# Patient Record
Sex: Female | Born: 1985 | Race: White | Hispanic: No | Marital: Married | Smoking: Never smoker
Health system: Southern US, Community
[De-identification: ages and names within clinical notes are randomized; demographics above are authoritative.]

## PROBLEM LIST (undated history)

## (undated) DIAGNOSIS — E538 Deficiency of other specified B group vitamins: Secondary | ICD-10-CM

## (undated) DIAGNOSIS — E559 Vitamin D deficiency, unspecified: Secondary | ICD-10-CM

## (undated) DIAGNOSIS — I493 Ventricular premature depolarization: Secondary | ICD-10-CM

## (undated) DIAGNOSIS — O139 Gestational [pregnancy-induced] hypertension without significant proteinuria, unspecified trimester: Secondary | ICD-10-CM

## (undated) DIAGNOSIS — I10 Essential (primary) hypertension: Secondary | ICD-10-CM

## (undated) DIAGNOSIS — F32A Depression, unspecified: Secondary | ICD-10-CM

## (undated) DIAGNOSIS — R7303 Prediabetes: Secondary | ICD-10-CM

## (undated) DIAGNOSIS — K219 Gastro-esophageal reflux disease without esophagitis: Secondary | ICD-10-CM

## (undated) DIAGNOSIS — F419 Anxiety disorder, unspecified: Secondary | ICD-10-CM

## (undated) HISTORY — PX: NO PAST SURGERIES: SHX2092

---

## 2013-03-03 DIAGNOSIS — G43009 Migraine without aura, not intractable, without status migrainosus: Secondary | ICD-10-CM | POA: Insufficient documentation

## 2013-10-27 DIAGNOSIS — E669 Obesity, unspecified: Secondary | ICD-10-CM | POA: Insufficient documentation

## 2013-10-27 DIAGNOSIS — E785 Hyperlipidemia, unspecified: Secondary | ICD-10-CM | POA: Insufficient documentation

## 2014-06-19 HISTORY — PX: ESOPHAGOGASTRODUODENOSCOPY: SHX1529

## 2018-03-12 ENCOUNTER — Emergency Department: Payer: BLUE CROSS/BLUE SHIELD

## 2018-03-12 ENCOUNTER — Other Ambulatory Visit: Payer: Self-pay

## 2018-03-12 ENCOUNTER — Encounter: Payer: Self-pay | Admitting: Emergency Medicine

## 2018-03-12 ENCOUNTER — Emergency Department
Admission: EM | Admit: 2018-03-12 | Discharge: 2018-03-12 | Disposition: A | Discharge status: Home / Self Care | Payer: BLUE CROSS/BLUE SHIELD | Attending: Emergency Medicine | Admitting: Emergency Medicine

## 2018-03-12 DIAGNOSIS — Z3202 Encounter for pregnancy test, result negative: Secondary | ICD-10-CM | POA: Insufficient documentation

## 2018-03-12 DIAGNOSIS — R1012 Left upper quadrant pain: Secondary | ICD-10-CM | POA: Insufficient documentation

## 2018-03-12 HISTORY — DX: Gastro-esophageal reflux disease without esophagitis: K21.9

## 2018-03-12 LAB — COMPREHENSIVE METABOLIC PANEL
ALBUMIN: 4 g/dL (ref 3.5–5.0)
ALT: 28 U/L (ref 0–44)
AST: 18 U/L (ref 15–41)
Alkaline Phosphatase: 88 U/L (ref 38–126)
Anion gap: 6 (ref 5–15)
BILIRUBIN TOTAL: 0.6 mg/dL (ref 0.3–1.2)
BUN: 11 mg/dL (ref 6–20)
CALCIUM: 9.3 mg/dL (ref 8.9–10.3)
CO2: 27 mmol/L (ref 22–32)
CREATININE: 0.83 mg/dL (ref 0.44–1.00)
Chloride: 103 mmol/L (ref 98–111)
GFR calc Af Amer: >60 mL/min (ref >60)
GFR calc non Af Amer: >60 mL/min (ref >60)
GLUCOSE: 87 mg/dL (ref 70–99)
Potassium: 3.6 mmol/L (ref 3.5–5.1)
Sodium: 136 mmol/L (ref 135–145)
TOTAL PROTEIN: 7.2 g/dL (ref 6.5–8.1)

## 2018-03-12 LAB — LIPASE, BLOOD: Lipase: 26 U/L (ref 11–51)

## 2018-03-12 LAB — CBC
HEMATOCRIT: 43.7 % (ref 35.0–47.0)
Hemoglobin: 14.5 g/dL (ref 12.0–16.0)
MCH: 25.5 pg — ABNORMAL LOW (ref 26.0–34.0)
MCHC: 33.2 g/dL (ref 32.0–36.0)
MCV: 76.8 fL — ABNORMAL LOW (ref 80.0–100.0)
Platelets: 347 10*3/uL (ref 150–440)
RBC: 5.68 MIL/uL — ABNORMAL HIGH (ref 3.80–5.20)
RDW: 15 % — AB (ref 11.5–14.5)
WBC: 18.7 10*3/uL — ABNORMAL HIGH (ref 3.6–11.0)

## 2018-03-12 LAB — URINALYSIS, COMPLETE (UACMP) WITH MICROSCOPIC
BACTERIA UA: NONE SEEN
Bilirubin Urine: NEGATIVE
Glucose, UA: NEGATIVE mg/dL
HGB URINE DIPSTICK: NEGATIVE
Ketones, ur: NEGATIVE mg/dL
Leukocytes, UA: NEGATIVE
NITRITE: NEGATIVE
Protein, ur: NEGATIVE mg/dL
SPECIFIC GRAVITY, URINE: 1.009 (ref 1.005–1.030)
pH: 6 (ref 5.0–8.0)

## 2018-03-12 LAB — POCT PREGNANCY, URINE: Preg Test, Ur: NEGATIVE

## 2018-03-12 LAB — TROPONIN I

## 2018-03-12 MED ORDER — GI COCKTAIL ~~LOC~~
30.0000 mL | Freq: Once | ORAL | Status: AC
  Administered 2018-03-12: 30 mL via ORAL
  Filled 2018-03-12: qty 30

## 2018-03-12 MED ORDER — SUCRALFATE 1 G PO TABS: 1.0000 g | ORAL_TABLET | Freq: Four times a day (QID) | ORAL | 0 refills | Status: DC

## 2018-03-12 MED ORDER — IOPAMIDOL (ISOVUE-300) INJECTION 61%
125.0000 mL | Freq: Once | INTRAVENOUS | Status: AC | PRN
  Administered 2018-03-12: 125 mL via INTRAVENOUS
  Filled 2018-03-12: qty 150

## 2018-03-12 NOTE — Discharge Instructions (Signed)
Please seek medical attention for any high fevers, chest pain, shortness of breath, change in behavior, persistent vomiting, bloody stool or any other new or concerning symptoms.  

## 2018-03-12 NOTE — ED Triage Notes (Signed)
Patient ambulatory to triage with steady gait, without difficulty or distress noted ; pt reports since yesterday having left upper abd pain radiating into left shoulder accomp by N/V

## 2018-03-12 NOTE — ED Provider Notes (Addendum)
[facility] Emergency Department Provider Note   ____________________________________________   I have reviewed the triage vital signs and the nursing notes.   HISTORY  Chief Complaint Abdominal Pain   History limited by: Not Limited   HPI Rachel Shaw is a 32 y.o. female who presents to the emergency department today because of concerns for abdominal pain.  Is located left upper quadrant.  Patient states she has had problems with pain in this area for over a year.  She states she has been told it was acid reflux in the past.  She also has undergone an EGD.  She states for the past 2 days however the pain is been more intense.  She does take her antiacid and states that she takes it regularly.  She denies any unusual ingestions.  She denies any shortness of breath with this.  She has had a cough for about a week.  Denies any fevers.  Denies any change in stooling.   Per medical record review patient has a history of GERD  Past Medical History:  Diagnosis Date  . GERD (gastroesophageal reflux disease)     There are no active problems to display for this patient.   History reviewed. No pertinent surgical history.  Prior to Admission medications   Not on File    Allergies Azo [phenazopyridine]  No family history on file.  Social History Social History   Tobacco Use  . Smoking status: Never Smoker  . Smokeless tobacco: Never Used  Substance Use Topics  . Alcohol use: Not on file  . Drug use: Not on file    Review of Systems Constitutional: No fever/chills Eyes: No visual changes. ENT: No sore throat. Cardiovascular: Denies chest pain. Respiratory: Denies shortness of breath. Gastrointestinal: Positive for abdominal pain Genitourinary: Negative for dysuria. Musculoskeletal: Negative for back pain. Skin: Negative for rash. Neurological: Negative for headaches, focal weakness or  numbness.  ____________________________________________   PHYSICAL EXAM:  VITAL SIGNS: ED Triage Vitals  Enc Vitals Group     BP 03/12/18 1956 (!) 152/84     Pulse Rate 03/12/18 1956 92     Resp 03/12/18 1956 18     Temp 03/12/18 1955 (P) 98.5 F (36.9 C)     Temp Source 03/12/18 1955 (P) Oral     SpO2 03/12/18 1956 99 %     Weight 03/12/18 1957 (!) 344 lb (156 kg)     Height 03/12/18 1957 5\' 10"  (1.778 m)     Head Circumference --      Peak Flow --      Pain Score 03/12/18 2000 5   Constitutional: Alert and oriented.  Eyes: Conjunctivae are normal.  ENT      Head: Normocephalic and atraumatic.      Nose: No congestion/rhinnorhea.      Mouth/Throat: Mucous membranes are moist.      Neck: No stridor. Hematological/Lymphatic/Immunilogical: No cervical lymphadenopathy. Cardiovascular: Normal rate, regular rhythm.  No murmurs, rubs, or gallops.  Respiratory: Normal respiratory effort without tachypnea nor retractions. Breath sounds are clear and equal bilaterally. No wheezes/rales/rhonchi. Gastrointestinal: Soft and non tender. No rebound. No guarding.  Genitourinary: Deferred Musculoskeletal: Normal range of motion in all extremities. No lower extremity edema. Neurologic:  Normal speech and language. No gross focal neurologic deficits are appreciated.  Skin:  Skin is warm, dry and intact. No rash noted. Psychiatric: Mood and affect are normal. Speech and behavior are normal. Patient exhibits appropriate insight and judgment.  ____________________________________________  LABS (pertinent positives/negatives)  Upreg negative CBC wbc 18.7, hgb 14.5, plt 347 UA unremarkable  ____________________________________________   EKG  I, Phineas Semen, attending physician, personally viewed and interpreted this EKG  EKG Time: 2002 Rate: 74 Rhythm: normal sinus rhythm Axis: normal Intervals: qtc 430 QRS: narrow ST changes: no st elevation Impression: left atrial  enlargement, abnormal ekg   ____________________________________________    RADIOLOGY  CT abd pel No acute findings  ____________________________________________   PROCEDURES  Procedures  ____________________________________________   INITIAL IMPRESSION / ASSESSMENT AND PLAN / ED COURSE  Pertinent labs & imaging results that were available during my care of the patient were reviewed by me and considered in my medical decision making (see chart for details).   Presented to the emergency department today because of concerns for left upper quadrant abdominal pain.  She does have a history of some chronic pain there but states is been worse recently.  Patient's blood work does show significant leukocytosis.  Discussed with the patient she states she is on steroids so it is somewhat hard to interpret.  Given that the pain was different in nature CT scan was obtained.  This did not show any concerning findings.  Discussed this with the patient.  At this point will plan on discharging to follow-up with primary care.  ____________________________________________   FINAL CLINICAL IMPRESSION(S) / ED DIAGNOSES  Final diagnoses:  Left upper quadrant pain     Note: This dictation was prepared with Dragon dictation. Any transcriptional errors that result from this process are unintentional     Phineas Semen, MD 03/12/18 2542    Phineas Semen, MD 03/27/18 [phone]

## 2018-03-13 ENCOUNTER — Emergency Department
Admission: EM | Admit: 2018-03-13 | Discharge: 2018-03-13 | Disposition: A | Discharge status: Home / Self Care | Payer: BLUE CROSS/BLUE SHIELD | Attending: Emergency Medicine | Admitting: Emergency Medicine

## 2018-03-13 ENCOUNTER — Encounter: Payer: Self-pay | Admitting: *Deleted

## 2018-03-13 ENCOUNTER — Emergency Department: Payer: BLUE CROSS/BLUE SHIELD

## 2018-03-13 ENCOUNTER — Other Ambulatory Visit: Payer: Self-pay

## 2018-03-13 DIAGNOSIS — K2900 Acute gastritis without bleeding: Secondary | ICD-10-CM | POA: Diagnosis not present

## 2018-03-13 DIAGNOSIS — R1013 Epigastric pain: Secondary | ICD-10-CM

## 2018-03-13 DIAGNOSIS — R1012 Left upper quadrant pain: Secondary | ICD-10-CM | POA: Diagnosis present

## 2018-03-13 LAB — COMPREHENSIVE METABOLIC PANEL
ALK PHOS: 89 U/L (ref 38–126)
ALT: 26 U/L (ref 0–44)
ANION GAP: 9 (ref 5–15)
AST: 15 U/L (ref 15–41)
Albumin: 4 g/dL (ref 3.5–5.0)
BILIRUBIN TOTAL: 0.6 mg/dL (ref 0.3–1.2)
BUN: 11 mg/dL (ref 6–20)
CALCIUM: 8.8 mg/dL — AB (ref 8.9–10.3)
CO2: 26 mmol/L (ref 22–32)
CREATININE: 0.79 mg/dL (ref 0.44–1.00)
Chloride: 104 mmol/L (ref 98–111)
Glucose, Bld: 99 mg/dL (ref 70–99)
Potassium: 4 mmol/L (ref 3.5–5.1)
Sodium: 139 mmol/L (ref 135–145)
TOTAL PROTEIN: 7.2 g/dL (ref 6.5–8.1)

## 2018-03-13 LAB — CBC
HCT: 42.7 % (ref 35.0–47.0)
Hemoglobin: 14.2 g/dL (ref 12.0–16.0)
MCH: 25.4 pg — AB (ref 26.0–34.0)
MCHC: 33.2 g/dL (ref 32.0–36.0)
MCV: 76.3 fL — AB (ref 80.0–100.0)
PLATELETS: 310 10*3/uL (ref 150–440)
RBC: 5.6 MIL/uL — AB (ref 3.80–5.20)
RDW: 15 % — ABNORMAL HIGH (ref 11.5–14.5)
WBC: 15.1 10*3/uL — ABNORMAL HIGH (ref 3.6–11.0)

## 2018-03-13 MED ORDER — ONDANSETRON HCL 4 MG/2ML IJ SOLN
4.0000 mg | Freq: Once | INTRAMUSCULAR | Status: AC
  Administered 2018-03-13: 4 mg via INTRAVENOUS
  Filled 2018-03-13: qty 2

## 2018-03-13 MED ORDER — FAMOTIDINE IN NACL 20-0.9 MG/50ML-% IV SOLN
20.0000 mg | Freq: Once | INTRAVENOUS | Status: AC
  Administered 2018-03-13: 20 mg via INTRAVENOUS
  Filled 2018-03-13: qty 50

## 2018-03-13 MED ORDER — ACETAMINOPHEN 500 MG PO TABS
1000.0000 mg | ORAL_TABLET | Freq: Once | ORAL | Status: AC
  Administered 2018-03-13: 1000 mg via ORAL
  Filled 2018-03-13: qty 2

## 2018-03-13 MED ORDER — OMEPRAZOLE 40 MG PO CPDR: 40.0000 mg | DELAYED_RELEASE_CAPSULE | Freq: Every day | ORAL | 0 refills | Status: DC

## 2018-03-13 MED ORDER — GI COCKTAIL ~~LOC~~: 30.0000 mL | Freq: Once | ORAL | Status: DC

## 2018-03-13 NOTE — ED Triage Notes (Signed)
Pt ambulatory to triage. Pt has left upper quad abd pain.  Pt was seen yesterday with similar sx and had normal ct scan per pt.  No n/v/d  Pt states pain is worse.  Pt alert

## 2018-03-13 NOTE — ED Provider Notes (Signed)
[facility] Emergency Department Provider Note  ____________________________________________  Time seen: Approximately 8:07 PM  I have reviewed the triage vital signs and the nursing notes.   HISTORY  Chief Complaint Abdominal Pain   HPI Rachel Shaw is a 32 y.o. female with a history of GERD who presents for evaluation of left upper quadrant abdominal pain. Patient reports 2 days of squeezing intermittent pain located in the left lower quadrant that is nonradiating and associated with nausea. The pain was worse today after she had [employer]. She was seen here yesterday with labs show leukocytosis of unknown etiology and a negative CT scan. Her pain was better when she went home. Today she had no pain until she ate [employer] and the pain returned. Currently the pain is 5 out of 10. No vomiting, no fever or chills, no chest pain, no shortness of breath, no pleuritic pain, no dysuria or hematuria. No prior history of peptic ulcer disease, no heavy alcohol use, no NSAIDs use. Patient denies any personal or family history of blood clots, recent travel or immobilization, leg pain or swelling, hemoptysis, or exogenous hormones.patient has been having normal bowel movements.patient reports the pain started 3 days after being put on prednisone for a viral URI.  Past Medical History:  Diagnosis Date  . GERD (gastroesophageal reflux disease)      Prior to Admission medications   Medication Sig Start Date End Date Taking? Authorizing Provider  omeprazole (PRILOSEC) 40 MG capsule Take 1 capsule (40 mg total) by mouth daily. 03/13/18 04/12/18  Nita Sickle, MD  sucralfate (CARAFATE) 1 g tablet Take 1 tablet (1 g total) by mouth 4 (four) times daily. 03/12/18   Phineas Semen, MD    Allergies Azo [phenazopyridine]  No family history on file.  Social History Social History   Tobacco Use  . Smoking status: Never Smoker  . Smokeless tobacco: Never Used    Substance Use Topics  . Alcohol use: Not Currently  . Drug use: Not Currently    Review of Systems  Constitutional: Negative for fever. Eyes: Negative for visual changes. ENT: Negative for sore throat. Neck: No neck pain  Cardiovascular: Negative for chest pain. Respiratory: Negative for shortness of breath. Gastrointestinal: + LUQ abdominal pain and nausea. No vomiting or diarrhea. Genitourinary: Negative for dysuria. Musculoskeletal: Negative for back pain. Skin: Negative for rash. Neurological: Negative for headaches, weakness or numbness. Psych: No SI or HI  ____________________________________________   PHYSICAL EXAM:  VITAL SIGNS: ED Triage Vitals [03/13/18 1722]  Enc Vitals Group     BP 121/82     Pulse Rate 97     Resp 20     Temp 98.8 F (37.1 C)     Temp Source Oral     SpO2      Weight      Height      Head Circumference      Peak Flow      Pain Score 7     Pain Loc      Pain Edu?      Excl. in GC?     Constitutional: Alert and oriented. Well appearing and in no apparent distress. HEENT:      Head: Normocephalic and atraumatic.         Eyes: Conjunctivae are normal. Sclera is non-icteric.       Mouth/Throat: Mucous membranes are moist.       Neck: Supple with no signs of meningismus. Cardiovascular: Regular rate and rhythm. No  murmurs, gallops, or rubs. 2+ symmetrical distal pulses are present in all extremities. No JVD. Respiratory: Normal respiratory effort. Lungs are clear to auscultation bilaterally. No wheezes, crackles, or rhonchi.  Gastrointestinal: Obese, tender to palpation over the epigastric and LUQ, and non distended with positive bowel sounds. No rebound or guarding. Genitourinary: No CVA tenderness. Musculoskeletal: Nontender with normal range of motion in all extremities. No edema, cyanosis, or erythema of extremities. Neurologic: Normal speech and language. Face is symmetric. Moving all extremities. No gross focal neurologic  deficits are appreciated. Skin: Skin is warm, dry and intact. No rash noted. Psychiatric: Mood and affect are normal. Speech and behavior are normal.  ____________________________________________   LABS (all labs ordered are listed, but only abnormal results are displayed)  Labs Reviewed  COMPREHENSIVE METABOLIC PANEL - Abnormal; Notable for the following components:      Result Value   Calcium 8.8 (*)    All other components within normal limits  CBC - Abnormal; Notable for the following components:   WBC 15.1 (*)    RBC 5.60 (*)    MCV 76.3 (*)    MCH 25.4 (*)    RDW 15.0 (*)    All other components within normal limits   ____________________________________________  EKG  ED ECG REPORT I, Nita Sickle, the attending physician, personally viewed and interpreted this ECG.  Normal sinus rhythm, rate of 74, normal intervals, normal axis, no ST elevations or depressions, T-wave inversions in anterior leads. unchanged from prior.  ____________________________________________  RADIOLOGY  I have personally reviewed the images performed during this visit and I agree with the Radiologist's read.   Interpretation by Radiologist:  Ct Abdomen Pelvis W Contrast  Result Date: 03/12/2018 CLINICAL DATA:  Left upper quadrant abdominal pain EXAM: CT ABDOMEN AND PELVIS WITH CONTRAST TECHNIQUE: Multidetector CT imaging of the abdomen and pelvis was performed using the standard protocol following bolus administration of intravenous contrast. CONTRAST:  ISOVUE-300 IOPAMIDOL (ISOVUE-300) INJECTION 61% COMPARISON:  None. FINDINGS: LOWER CHEST: There is no basilar pleural or apical pericardial effusion. HEPATOBILIARY: The hepatic contours and density are normal. There is no intra- or extrahepatic biliary dilatation. The gallbladder is normal. PANCREAS: The pancreatic parenchymal contours are normal and there is no ductal dilatation. There is no peripancreatic fluid collection. SPLEEN:  Normal. ADRENALS/URINARY TRACT: --Adrenal glands: Normal. --Right kidney/ureter: No hydronephrosis, nephroureterolithiasis, perinephric stranding or solid renal mass. --Left kidney/ureter: No hydronephrosis, nephroureterolithiasis, perinephric stranding or solid renal mass. --Urinary bladder: Normal for degree of distention STOMACH/BOWEL: --Stomach/Duodenum: There is no hiatal hernia or other gastric abnormality. The duodenal course and caliber are normal. --Small bowel: No dilatation or inflammation. --Colon: No focal abnormality. --Appendix: Normal. VASCULAR/LYMPHATIC: Normal course and caliber of the major abdominal vessels. No abdominal or pelvic lymphadenopathy. REPRODUCTIVE: Normal uterus and ovaries. MUSCULOSKELETAL. No bony spinal canal stenosis or focal osseous abnormality. OTHER: None. IMPRESSION: Normal CT of the abdomen and pelvis. Electronically Signed   By: Deatra Robinson M.D.   On: 03/12/2018 22:58   US Abdomen Limited Ruq  Result Date: 03/13/2018 CLINICAL DATA:  Epigastric pain for 3 days with vomiting EXAM: ULTRASOUND ABDOMEN LIMITED RIGHT UPPER QUADRANT COMPARISON:  03/12/2018 FINDINGS: Gallbladder: Nonmobile echogenic 6 mm focus along the gallbladder wall, suspect polyp. No definite gallstones. No Murphy's sign or pericholecystic fluid. Normal wall thickness measuring 2.5 mm. Common bile duct: Diameter: 2.6 mm Liver: Mild heterogeneous echogenicity, suspect degree of fatty infiltration with areas of fatty sparing. Portal vein is patent on color Doppler imaging with normal  direction of blood flow towards the liver. IMPRESSION: No acute finding by ultrasound. Suspect tiny gallbladder polyp Negative for gallstones or biliary dilatation Suspect geographic fatty infiltration with areas of fatty sparing Electronically Signed   By: Judie Petit.  Shick M.D.   On: 03/13/2018 20:18     ____________________________________________   PROCEDURES  Procedure(s) performed: None Procedures Critical Care  performed:  None ____________________________________________   INITIAL IMPRESSION / ASSESSMENT AND PLAN / ED COURSE  32 y.o. female with a history of GERD who presents for evaluation of left upper quadrant abdominal pain.patient is well-appearing, in no distress, she has normal vital signs, abdomen is soft with epigastric and left upper quadrant tenderness, no rebound or guarding. Patient had CT done yesterday which did not show any acute findings. She is PERC negative. Labs yesterday showing a leukocytosis in the setting of being on prednisone. Patient reports that she did not take prednisone yesterday or today and her white count is trending down. Labs no other acute findings. We'll send patient for right upper quadrant ultrasound to rule out cholecystitis/choledocholithiasis. If that is negative patient's most likely etiology would be gastritis or peptic ulcer disease specialist since symptoms started 3 days after being on prednisone. I asked patient to stop taking prednisone. I'll give her IV Pepcid and Zofran. The patient reports that she took a GI cocktail yesterday and that didn't help so we'll hold off that at this time.  Clinical Course as of Mar 14 2203  Wed Mar 13, 2018  2202 patient feels markedly improved with resolution of her abdominal pain. She'll be discharged home on omeprazole for 4 weeks for concerns of gastritis/peptic ulcer disease. Recommended follow-up with her GI doctor if the pain recurs for an endoscopy. Discussed return precautions for new or worsening abdominal pain fever chest pain.   [CV]    Clinical Course User Index [CV] Don Perking [place], MD     As part of my medical decision making, I reviewed the following data within the electronic MEDICAL RECORD NUMBER Nursing notes reviewed and incorporated, Labs reviewed , Old chart reviewed, Radiograph reviewed , Notes from prior ED visits and [place] Controlled Substance Database    Pertinent labs & imaging results that were  available during my care of the patient were reviewed by me and considered in my medical decision making (see chart for details).    ____________________________________________   FINAL CLINICAL IMPRESSION(S) / ED DIAGNOSES  Final diagnoses:  Epigastric abdominal pain  Acute gastritis without hemorrhage, unspecified gastritis type      NEW MEDICATIONS STARTED DURING THIS VISIT:  ED Discharge Orders         Ordered    omeprazole (PRILOSEC) 40 MG capsule  Daily     03/13/18 2203           Note:  This document was prepared using Dragon voice recognition software and may include unintentional dictation errors.    Nita Sickle, MD 03/13/18 2204

## 2018-03-13 NOTE — ED Notes (Signed)
Pt ambulatory to POV without difficulty. VSS. NAD. Discharge instructions, RX and follow up reviewed. All questions and concerns addressed.  

## 2018-03-13 NOTE — Discharge Instructions (Addendum)

## 2018-06-19 HISTORY — PX: SIGMOIDOSCOPY: SUR1295

## 2018-07-01 ENCOUNTER — Emergency Department
Admission: EM | Admit: 2018-07-01 | Discharge: 2018-07-01 | Disposition: A | Discharge status: Home / Self Care | Payer: Managed Care, Other (non HMO) | Attending: Student in an Organized Health Care Education/Training Program | Admitting: Student in an Organized Health Care Education/Training Program

## 2018-07-01 ENCOUNTER — Emergency Department: Payer: Managed Care, Other (non HMO)

## 2018-07-01 ENCOUNTER — Encounter: Payer: Self-pay | Admitting: Emergency Medicine

## 2018-07-01 ENCOUNTER — Other Ambulatory Visit: Payer: Self-pay

## 2018-07-01 DIAGNOSIS — R002 Palpitations: Secondary | ICD-10-CM | POA: Insufficient documentation

## 2018-07-01 DIAGNOSIS — R079 Chest pain, unspecified: Secondary | ICD-10-CM | POA: Diagnosis not present

## 2018-07-01 HISTORY — DX: Ventricular premature depolarization: I49.3

## 2018-07-01 LAB — CBC
HCT: 45.3 % (ref 36.0–46.0)
Hemoglobin: 14 g/dL (ref 12.0–15.0)
MCH: 24.4 pg — AB (ref 26.0–34.0)
MCHC: 30.9 g/dL (ref 30.0–36.0)
MCV: 79.1 fL — AB (ref 80.0–100.0)
Platelets: 332 10*3/uL (ref 150–400)
RBC: 5.73 MIL/uL — AB (ref 3.87–5.11)
RDW: 14.6 % (ref 11.5–15.5)
WBC: 11 10*3/uL — ABNORMAL HIGH (ref 4.0–10.5)
nRBC: 0 % (ref 0.0–0.2)

## 2018-07-01 LAB — TROPONIN I: Troponin I: <0.03 ng/mL (ref <0.03)

## 2018-07-01 LAB — BASIC METABOLIC PANEL
ANION GAP: 11 (ref 5–15)
BUN: 8 mg/dL (ref 6–20)
CALCIUM: 9.1 mg/dL (ref 8.9–10.3)
CO2: 22 mmol/L (ref 22–32)
CREATININE: 0.64 mg/dL (ref 0.44–1.00)
Chloride: 104 mmol/L (ref 98–111)
GFR calc Af Amer: >60 mL/min (ref >60)
GFR calc non Af Amer: >60 mL/min (ref >60)
GLUCOSE: 87 mg/dL (ref 70–99)
Potassium: 4.3 mmol/L (ref 3.5–5.1)
Sodium: 137 mmol/L (ref 135–145)

## 2018-07-01 LAB — POCT PREGNANCY, URINE: Preg Test, Ur: NEGATIVE

## 2018-07-01 MED ORDER — SUCRALFATE 1 G PO TABS: 1.0000 g | ORAL_TABLET | Freq: Four times a day (QID) | ORAL | 0 refills | Status: DC

## 2018-07-01 NOTE — ED Triage Notes (Signed)
Lab called for assistance with blood draw. Multiple attempts from ED staff.

## 2018-07-01 NOTE — ED Triage Notes (Signed)
chst ( left side of breast) and back from low into shoulder blades.  For 2 hours.

## 2018-07-01 NOTE — ED Notes (Signed)
.   Pt is resting, Respirations even and unlabored, NAD. Stretcher lowest postion and locked. Call bell within reach. Denies any needs at this time RN will continue to monitor.    

## 2018-07-01 NOTE — ED Provider Notes (Signed)
[facility] Emergency Department Provider Note    First MD Initiated Contact with Patient 07/01/18 1710     (approximate)  I have reviewed the triage vital signs and the nursing notes.   HISTORY  Chief Complaint Chest Pain    HPI Rachel Shaw is a 33 y.o. female below listed past medical history presents the ER with chief complaint of palpitations and chest discomfort.  States that she been having palpitations more frequently.  Denies any fevers.  No shortness of breath.  States that she did have chest pain related to palpitations because she started feeling very anxious.  States that she is worried that food is causing the symptoms.  States that she does have history of GERD and feels that whenever her GERD symptoms are acting up she has more frequent palpitations.  She has been extensively evaluated by cardiology for similar symptoms.  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  No medication changes.  She is not on birth control.  Denies any chance of being pregnant.  No abdominal pain at this time.  No dysuria.    Past Medical History:  Diagnosis Date  . GERD (gastroesophageal reflux disease)   . PVC (premature ventricular contraction)    No family history on file. History reviewed. No pertinent surgical history. There are no active problems to display for this patient.     Prior to Admission medications   Medication Sig Start Date End Date Taking? Authorizing Provider  omeprazole (PRILOSEC) 40 MG capsule Take 1 capsule (40 mg total) by mouth daily. 03/13/18 04/12/18  Nita Sickle, MD  sucralfate (CARAFATE) 1 g tablet Take 1 tablet (1 g total) by mouth 4 (four) times daily. 07/01/18   Willy Eddy, MD    Allergies Azo [phenazopyridine]    Social History Social History   Tobacco Use  . Smoking status: Never Smoker  . Smokeless tobacco: Never Used  Substance Use Topics  . Alcohol use: Not Currently  . Drug use: Not Currently     Review of Systems Patient denies headaches, rhinorrhea, blurry vision, numbness, shortness of breath, chest pain, edema, cough, abdominal pain, nausea, vomiting, diarrhea, dysuria, fevers, rashes or hallucinations unless otherwise stated above in HPI. ____________________________________________   PHYSICAL EXAM:  VITAL SIGNS: Vitals:   07/01/18 1730 07/01/18 1745  BP:    Pulse: 89   Resp: 18 (!) 21  Temp:    SpO2: 100%     Constitutional: Alert and oriented.  Eyes: Conjunctivae are normal.  Head: Atraumatic. Nose: No congestion/rhinnorhea. Mouth/Throat: Mucous membranes are moist.   Neck: No stridor. Painless ROM.  Cardiovascular: Normal rate, regular rhythm. Grossly normal heart sounds.  Good peripheral circulation. Respiratory: Normal respiratory effort.  No retractions. Lungs CTAB. Gastrointestinal: Soft and nontender. No distention. No abdominal bruits. No CVA tenderness. Genitourinary:  Musculoskeletal: No lower extremity tenderness nor edema.  No joint effusions. Neurologic:  Normal speech and language. No gross focal neurologic deficits are appreciated. No facial droop Skin:  Skin is warm, dry and intact. No rash noted. Psychiatric: Mood and affect are normal. Speech and behavior are normal.  ____________________________________________   LABS (all labs ordered are listed, but only abnormal results are displayed)  Results for orders placed or performed during the hospital encounter of 07/01/18 (from the past 24 hour(s))  Basic metabolic panel     Status: None   Collection Time: 07/01/18  3:18 PM  Result Value Ref Range   Sodium 137 135 - 145 mmol/L  Potassium 4.3 3.5 - 5.1 mmol/L   Chloride 104 98 - 111 mmol/L   CO2 22 22 - 32 mmol/L   Glucose, Bld 87 70 - 99 mg/dL   BUN 8 6 - 20 mg/dL   Creatinine, Ser 1.610.64 0.44 - 1.00 mg/dL   Calcium 9.1 8.9 - 09.610.3 mg/dL   GFR calc non Af Amer >60 >60 mL/min   GFR calc Af Amer >60 >60 mL/min   Anion gap 11 5 - 15   CBC     Status: Abnormal   Collection Time: 07/01/18  3:18 PM  Result Value Ref Range   WBC 11.0 (H) 4.0 - 10.5 K/uL   RBC 5.73 (H) 3.87 - 5.11 MIL/uL   Hemoglobin 14.0 12.0 - 15.0 g/dL   HCT 04.545.3 40.936.0 - 81.146.0 %   MCV 79.1 (L) 80.0 - 100.0 fL   MCH 24.4 (L) 26.0 - 34.0 pg   MCHC 30.9 30.0 - 36.0 g/dL   RDW 91.414.6 78.211.5 - 95.615.5 %   Platelets 332 150 - 400 K/uL   nRBC 0.0 0.0 - 0.2 %  Troponin I - ONCE - STAT     Status: None   Collection Time: 07/01/18  3:18 PM  Result Value Ref Range   Troponin I <0.03 <0.03 ng/mL  Pregnancy, urine POC     Status: None   Collection Time: 07/01/18  6:14 PM  Result Value Ref Range   Preg Test, Ur NEGATIVE NEGATIVE   ____________________________________________  EKG My review and personal interpretation at Time: 15:05   Indication: chest pain  Rate: 90  Rhythm: sinus Axis: normal Other: normal intervals, no stemi, nonspecific  t wave abn, ____________________________________________  RADIOLOGY  I personally reviewed all radiographic images ordered to evaluate for the above acute complaints and reviewed radiology reports and findings.  These findings were personally discussed with the patient.  Please see medical record for radiology report.  ____________________________________________   PROCEDURES  Procedure(s) performed:  Procedures    Critical Care performed: no ____________________________________________   INITIAL IMPRESSION / ASSESSMENT AND PLAN / ED COURSE  Pertinent labs & imaging results that were available during my care of the patient were reviewed by me and considered in my medical decision making (see chart for details).   DDX: ACS, pericarditis, esophagitis, boerhaaves, pe, dissection, pna, bronchitis, costochondritis   Rachel Shaw is a 33 y.o. who presents to the ED with symptoms as described above.  She is afebrile hemodynamically stable.  EKG shows no evidence of dysrhythmia, preexcitation syndrome or ischemia.   Low risk heart score of 1 versus 2.  Troponin is negative.  She is low risk by Wells criteria and is PERC negative.  Blood work reassuring as well as chest x-ray reassuring.  She is minimally symptomatic at this time.  I do believe she stable and appropriate for outpatient follow-up.      As part of my medical decision making, I reviewed the following data within the electronic MEDICAL RECORD NUMBER Nursing notes reviewed and incorporated, Labs reviewed, notes from prior ED visits.  ____________________________________________   FINAL CLINICAL IMPRESSION(S) / ED DIAGNOSES  Final diagnoses:  Palpitations      NEW MEDICATIONS STARTED DURING THIS VISIT:  Current Discharge Medication List       Note:  This document was prepared using Dragon voice recognition software and may include unintentional dictation errors.    Willy Eddyobinson, Annaka Cleaver, MD 07/01/18 [phone]

## 2018-07-12 DIAGNOSIS — F411 Generalized anxiety disorder: Secondary | ICD-10-CM | POA: Insufficient documentation

## 2018-09-09 ENCOUNTER — Telehealth: Payer: Self-pay | Admitting: *Deleted

## 2018-09-09 NOTE — Telephone Encounter (Signed)
Attempted to reach the patient concerning her appointment and the COVID prescreening. Was unable to leave a voicemail due to the box being full.

## 2018-09-10 ENCOUNTER — Ambulatory Visit: Payer: Self-pay | Admitting: Cardiovascular Disease

## 2018-09-10 NOTE — Telephone Encounter (Signed)
The patient did not come in for her appointment.

## 2018-09-16 ENCOUNTER — Telehealth: Payer: Self-pay | Admitting: Physician Assistant

## 2018-09-16 NOTE — Telephone Encounter (Signed)
No answer and could not leave a voice message due to answering machine been too full.

## 2018-09-16 NOTE — Telephone Encounter (Signed)
Can we reschedule her CPE please?

## 2018-09-17 NOTE — Telephone Encounter (Signed)
Patient canceled

## 2018-09-17 NOTE — Telephone Encounter (Signed)
No answer. Unable to LM

## 2018-09-19 ENCOUNTER — Ambulatory Visit: Payer: Self-pay | Admitting: Physician Assistant

## 2018-12-17 DIAGNOSIS — H47333 Pseudopapilledema of optic disc, bilateral: Secondary | ICD-10-CM | POA: Insufficient documentation

## 2019-01-13 ENCOUNTER — Other Ambulatory Visit: Payer: Self-pay | Admitting: Neurology

## 2019-01-13 DIAGNOSIS — G932 Benign intracranial hypertension: Secondary | ICD-10-CM

## 2019-01-13 DIAGNOSIS — G43019 Migraine without aura, intractable, without status migrainosus: Secondary | ICD-10-CM

## 2019-01-23 ENCOUNTER — Ambulatory Visit
Admission: RE | Admit: 2019-01-23 | Discharge: 2019-01-23 | Disposition: A | Discharge status: Home / Self Care | Payer: Managed Care, Other (non HMO) | Source: Ambulatory Visit | Attending: Neurology | Admitting: Neurology

## 2019-01-23 ENCOUNTER — Other Ambulatory Visit: Payer: Self-pay

## 2019-01-23 DIAGNOSIS — G932 Benign intracranial hypertension: Secondary | ICD-10-CM | POA: Insufficient documentation

## 2019-01-23 DIAGNOSIS — G43019 Migraine without aura, intractable, without status migrainosus: Secondary | ICD-10-CM | POA: Insufficient documentation

## 2019-01-23 MED ORDER — GADOBUTROL 1 MMOL/ML IV SOLN
10.0000 mL | Freq: Once | INTRAVENOUS | Status: AC | PRN
  Administered 2019-01-23: 10 mL via INTRAVENOUS

## 2019-03-14 DIAGNOSIS — G932 Benign intracranial hypertension: Secondary | ICD-10-CM | POA: Insufficient documentation

## 2019-03-14 DIAGNOSIS — E538 Deficiency of other specified B group vitamins: Secondary | ICD-10-CM | POA: Insufficient documentation

## 2019-03-14 DIAGNOSIS — E559 Vitamin D deficiency, unspecified: Secondary | ICD-10-CM | POA: Insufficient documentation

## 2019-06-20 NOTE — L&D Delivery Note (Signed)
Delivery Note  Rachel Shaw is a G1P1001 at [redacted]w[redacted]d with an LMP of 09/12/2019, inconsistent with [place] at [redacted]w[redacted]d.   First Stage: Labor onset: 0415 Induction: misoprostol, cervical balloon, AROM, oxytocine Analgesia /Anesthesia intrapartum: Epidural AROM at 0810 for meconium   Second Stage: Complete dilation at 2034 Onset of pushing at 2045 FHR second stage 155bpm with moderate variability.  Worsening deep lates with pushing, positioned to right side with pushing.  Prolong decel with crowning.   Rachel presented to L&D for scheduled IOL for preeclampsia.  She had one dose of misoprostol but started to have recurrent late decelerations.  Cervical balloon was placed and was quickly expelled.  Internal monitors were placed after AROM and oxytocin was initiated.  She progressed to C/C/+2 with a spontaneous urge to push.  Rachel had excellent maternal effort and pushed well over 30 minutes.   Delivery of a viable baby boy on 06/14/2020 at 2118 by CNM Delivery of fetal head in ROA position with restitution to ROT. Loose nuchal cord, delivered through;  Anterior then posterior shoulders delivered easily with gentle downward traction, left compound hand noted during delivery. Baby placed on mom's chest, and attended to by baby RN Cord double clamped after cessation of pulsation, cut by father of baby  Cord blood sample collected: O pos   Third Stage: Oxytocin bolus started after delivery of infant for hemorrhage prophylaxis  Placenta delivered intact with 3 VC @ 2125 Placenta disposition: discarded Uterine tone firm / bleeding moderate  Left sulcal, 1st vaginal, and periurethral laceration identified  Anesthesia for repair: epidural Repair of left sulcal and vaginal with 2-0 chromic CT in usual fashion.  Straight cath placed for repair of periurethral.  3-0 chromic SH used, approximation achieved with 2 interrupted sutures.   Est. Blood Loss (mL):  Complications: none   Mom to  postpartum.  Baby to Couplet care / Skin to Skin.  Newborn: Information for the patient's newborn:  [name], [name] [[id number]]  Live born female "[name]" Birth Weight:  Pending  APGAR: 7, 9  Newborn Delivery   Birth date/time: 06/14/2020 21:18:00 Delivery type: Vaginal, Spontaneous     Feeding planned: breast   ---------- Margaretmary Eddy, CNM Certified Nurse Midwife [place]  [facility]

## 2019-11-07 DIAGNOSIS — O0993 Supervision of high risk pregnancy, unspecified, third trimester: Secondary | ICD-10-CM | POA: Insufficient documentation

## 2019-12-15 LAB — OB RESULTS CONSOLE RUBELLA ANTIBODY, IGM
Rubella: IMMUNE
Rubella: IMMUNE

## 2019-12-15 LAB — OB RESULTS CONSOLE VARICELLA ZOSTER ANTIBODY, IGG
Varicella: IMMUNE
Varicella: IMMUNE

## 2019-12-15 LAB — OB RESULTS CONSOLE HEPATITIS B SURFACE ANTIGEN
Hepatitis B Surface Ag: NEGATIVE
Hepatitis B Surface Ag: NEGATIVE

## 2020-01-05 ENCOUNTER — Other Ambulatory Visit: Payer: Managed Care, Other (non HMO) | Attending: Obstetrics and Gynecology

## 2020-03-05 ENCOUNTER — Other Ambulatory Visit: Payer: Managed Care, Other (non HMO) | Attending: Certified Nurse Midwife

## 2020-04-16 ENCOUNTER — Telehealth: Payer: Managed Care, Other (non HMO) | Admitting: Physician Assistant

## 2020-04-21 ENCOUNTER — Other Ambulatory Visit: Payer: Self-pay

## 2020-04-21 ENCOUNTER — Encounter
Admission: RE | Admit: 2020-04-21 | Discharge: 2020-04-21 | Disposition: A | Discharge status: Home / Self Care | Payer: Managed Care, Other (non HMO) | Source: Ambulatory Visit | Attending: Anesthesiology | Admitting: Anesthesiology

## 2020-04-21 NOTE — Consult Note (Signed)
[facility] Anesthesia Consultation  Rachel Shaw IOX:[id number] DOB: [birth year] DOA: 04/21/2020 PCP: Christiansen, [place] Nicole, PA-C   Requesting physician: Dr. Feliberto Gottron Date of consultation: 04/21/20 Reason for consultation: Obesity during pregnancy  CHIEF COMPLAINT:  Obesity during pregnancy  HISTORY OF PRESENT ILLNESS: Rachel Shaw  is a 34 y.o. female with a known history of obesity during pregnancy. This is her first pregnancy. Has been seen by neurology in the past for possible idiopathic intracranial hypertension. Had MRI that was unremarkable. No current symptoms of intracranial hypertension. Denies hx of cardiovascular disease. Denies hx of asthma. Denies personal or family hx of bleeding disorders.   PAST MEDICAL HISTORY:   Past Medical History:  Diagnosis Date  . GERD (gastroesophageal reflux disease)   . PVC (premature ventricular contraction)     PAST SURGICAL HISTORY: No past surgical history on file.  SOCIAL HISTORY:  Social History   Tobacco Use  . Smoking status: Never Smoker  . Smokeless tobacco: Never Used  Substance Use Topics  . Alcohol use: Not Currently    FAMILY HISTORY: No family history on file.  DRUG ALLERGIES:  Allergies  Allergen Reactions  . Azo [Phenazopyridine]     REVIEW OF SYSTEMS:   RESPIRATORY: No cough, shortness of breath, wheezing.  CARDIOVASCULAR: No chest pain, orthopnea, edema.  HEMATOLOGY: No anemia, easy bruising or bleeding SKIN: No rash or lesion. NEUROLOGIC: No tingling, numbness, weakness.  PSYCHIATRY: No anxiety or depression.   MEDICATIONS AT HOME:  Prior to Admission medications   Medication Sig Start Date End Date Taking? Authorizing Provider  omeprazole (PRILOSEC) 40 MG capsule Take 1 capsule (40 mg total) by mouth daily. 03/13/18 04/12/18  Nita Sickle, MD  sucralfate (CARAFATE) 1 g tablet Take 1 tablet (1 g total) by mouth 4 (four) times daily. 07/01/18    Willy Eddy, MD      PHYSICAL EXAMINATION:   VITAL SIGNS: There were no vitals taken for this visit.  GENERAL:  34 y.o.-year-old patient no acute distress.  HEENT: Head atraumatic, normocephalic. Oropharynx and nasopharynx clear. MP 1, TM distance >3 cm, normal mouth opening, grade 1 upper lip bite LUNGS: No use of accessory muscles of respiration.   EXTREMITIES: No pedal edema, cyanosis, or clubbing.  NEUROLOGIC: normal gait PSYCHIATRIC: The patient is alert and oriented x 3.  SKIN: No obvious rash, lesion, or ulcer.    IMPRESSION AND PLAN:   Rachel Shaw  is a 34 y.o. female presenting with obesity during pregnancy. BMI is currently 49 at [redacted] weeks gestation.   Airway exam reassuring. Spinal interspaces minimally palpable.  Pt desires epidural for labor analgesia. Hx of IIH is not a contraindication to epidural placement. No anatomic abnormality seen on MRI to limit CSF flow between cerebrum and spinal canal.   We discussed analgesic options during labor including epidural analgesia. Discussed that in obesity there can be increased difficulty with epidural placement or even failure of successful epidural. We also discussed that even after successful epidural placement there is increased risk of catheter migration out of the epidural space that would require catheter replacement. Discussed use of epidural vs spinal vs GA if cesarean delivery is required. Discussed increased risk of difficult intubation during pregnancy should an emergency cesarean delivery be required.   We discussed repeat evaluation at 35-36 weeks by anesthesia to determine whether there is a high risk of complications of anesthesia for which we would recommend transfer of OB care to a facility with a higher maternal level of care  designation.

## 2020-06-01 ENCOUNTER — Other Ambulatory Visit: Payer: Self-pay

## 2020-06-01 ENCOUNTER — Observation Stay
Admission: EM | Admit: 2020-06-01 | Discharge: 2020-06-01 | Disposition: A | Discharge status: Home / Self Care | Payer: Managed Care, Other (non HMO)

## 2020-06-01 DIAGNOSIS — Z3A35 35 weeks gestation of pregnancy: Secondary | ICD-10-CM | POA: Diagnosis not present

## 2020-06-01 DIAGNOSIS — O133 Gestational [pregnancy-induced] hypertension without significant proteinuria, third trimester: Secondary | ICD-10-CM | POA: Insufficient documentation

## 2020-06-01 DIAGNOSIS — O1493 Unspecified pre-eclampsia, third trimester: Secondary | ICD-10-CM | POA: Diagnosis present

## 2020-06-01 DIAGNOSIS — Z7982 Long term (current) use of aspirin: Secondary | ICD-10-CM | POA: Diagnosis not present

## 2020-06-01 DIAGNOSIS — R030 Elevated blood-pressure reading, without diagnosis of hypertension: Secondary | ICD-10-CM | POA: Diagnosis present

## 2020-06-01 LAB — PROTEIN / CREATININE RATIO, URINE
Creatinine, Urine: 64 mg/dL
Protein Creatinine Ratio: 0.8 mg/mg{Cre} — ABNORMAL HIGH (ref 0.00–0.15)
Total Protein, Urine: 51 mg/dL

## 2020-06-01 LAB — COMPREHENSIVE METABOLIC PANEL
ALT: 11 U/L (ref 0–44)
AST: 13 U/L — ABNORMAL LOW (ref 15–41)
Albumin: 2.6 g/dL — ABNORMAL LOW (ref 3.5–5.0)
Alkaline Phosphatase: 135 U/L — ABNORMAL HIGH (ref 38–126)
Anion gap: 9 (ref 5–15)
BUN: 6 mg/dL (ref 6–20)
CO2: 18 mmol/L — ABNORMAL LOW (ref 22–32)
Calcium: 8.2 mg/dL — ABNORMAL LOW (ref 8.9–10.3)
Chloride: 107 mmol/L (ref 98–111)
Creatinine, Ser: 0.59 mg/dL (ref 0.44–1.00)
GFR, Estimated: >60 mL/min (ref >60)
Glucose, Bld: 79 mg/dL (ref 70–99)
Potassium: 3.7 mmol/L (ref 3.5–5.1)
Sodium: 134 mmol/L — ABNORMAL LOW (ref 135–145)
Total Bilirubin: 0.3 mg/dL (ref 0.3–1.2)
Total Protein: 6.4 g/dL — ABNORMAL LOW (ref 6.5–8.1)

## 2020-06-01 LAB — CBC
HCT: 37.6 % (ref 36.0–46.0)
Hemoglobin: 12.6 g/dL (ref 12.0–15.0)
MCH: 26.5 pg (ref 26.0–34.0)
MCHC: 33.5 g/dL (ref 30.0–36.0)
MCV: 79 fL — ABNORMAL LOW (ref 80.0–100.0)
Platelets: 292 10*3/uL (ref 150–400)
RBC: 4.76 MIL/uL (ref 3.87–5.11)
RDW: 13.6 % (ref 11.5–15.5)
WBC: 10.2 10*3/uL (ref 4.0–10.5)
nRBC: 0 % (ref 0.0–0.2)

## 2020-06-01 MED ORDER — ACETAMINOPHEN 325 MG PO TABS: 650.0000 mg | ORAL_TABLET | ORAL | Status: DC | PRN

## 2020-06-01 MED ORDER — CALCIUM CARBONATE ANTACID 500 MG PO CHEW: 2.0000 | CHEWABLE_TABLET | ORAL | Status: DC | PRN

## 2020-06-01 NOTE — Progress Notes (Signed)
Pt given discharge instructions including labor precautions and signs/symptoms of preeclampsia to monitor for. Pt educated on induction of labor scheduled for 06/14/20 at 0001. Pt instructed on covid testing 12/24 from 8a-12. Pt verbalized understanding. Discharged with significant other.

## 2020-06-01 NOTE — Progress Notes (Signed)
G1P0 with at [redacted]w[redacted]d, LMP of 09/12/2019, not c/w early [place] at [redacted]w[redacted]d.  Scheduled for induction of labor for preeclampsia without severe features on 06/14/2020.   Prenatal provider: [facility] Pregnancy complicated by: 1. Preeclampsia without severe features  2. Obesity in pregnancy - BMI 51 3. Uterine S>D - EFW on 05/20/20 2582g (63%) 4. History of childhood seizures - possibly intracranial idiopathic hypertension   Prenatal Labs: Blood type/Rh O pos  Antibody screen neg  Rubella Immune  Varicella Immune  RPR NR  HBsAg Neg  HIV NR  GC neg  Chlamydia neg  Genetic screening negative  1 hour GTT 125  3 hour GTT N/A  GBS Pending    Tdap: 04/21/2020 Flu: 03/19/2020 Contraception: TBD Feeding preference: TBD  ____ Margaretmary Eddy, CNM Certified Nurse Midwife [place]  [facility]

## 2020-06-01 NOTE — Discharge Summary (Signed)
Rachel Shaw is a 34 y.o. female. She is at [redacted]w[redacted]d gestation. No LMP recorded. Patient is pregnant. Estimated Date of Delivery: 07/03/20  Prenatal care site: [facility]  Chief complaint: elevated b/p and pulse at home  Rachel presents to L&D triage today via EMS d/t elevated blood pressures at home.  She states that her b/p's were 140's/90's.  She felt nervous and called EMS.  States at one point her pulse was in the 160's.  Rachel also reports swelling in her feet and ankles.  Denies HA, changes in vision, or RUQ pan.   S: Resting comfortably. no CTX, no VB.no LOF,  Active fetal movement.   Maternal Medical History:  Past Medical Hx:  has a past medical history of GERD (gastroesophageal reflux disease) and PVC (premature ventricular contraction).    Past Surgical Hx:  has no past surgical history on file.   Allergies  Allergen Reactions  . Azo [Phenazopyridine]      Prior to Admission medications   Medication Sig Start Date End Date Taking? Authorizing Provider  aspirin 81 MG chewable tablet Chew 81 mg by mouth daily.   Yes [provider]  omeprazole (PRILOSEC) 10 MG capsule Take 10 mg by mouth daily.   Yes [provider]  Prenatal Vit-Fe Fumarate-FA (PRENATAL MULTIVITAMIN) TABS tablet Take 1 tablet by mouth daily at 12 noon.   Yes [provider]  omeprazole (PRILOSEC) 40 MG capsule Take 1 capsule (40 mg total) by mouth daily. 03/13/18 04/12/18  Nita Sickle, MD  sucralfate (CARAFATE) 1 g tablet Take 1 tablet (1 g total) by mouth 4 (four) times daily. Patient not taking: Reported on 06/01/2020 07/01/18   Willy Eddy, MD    Social History: She  reports that she has never smoked. She has never used smokeless tobacco. She reports previous alcohol use. She reports previous drug use.  Family History: no history of gyn cancers  Review of Systems: A full review of systems was performed and negative except as noted in the HPI.     O:  BP (!) 145/94   Pulse 77   Temp 98.3 F (36.8 C) (Oral)   Resp 18   Ht 5\' 11"  (1.803 m)   Wt (!) 166 kg   SpO2 97%   BMI 51.05 kg/m  Results for orders placed or performed during the hospital encounter of 06/01/20 (from the past 48 hour(s))  CBC   Collection Time: 06/01/20 12:39 PM  Result Value Ref Range   WBC 10.2 4.0 - 10.5 K/uL   RBC 4.76 3.87 - 5.11 MIL/uL   Hemoglobin 12.6 12.0 - 15.0 g/dL   HCT 06/03/20 32.6 - 71.2 %   MCV 79.0 (L) 80.0 - 100.0 fL   MCH 26.5 26.0 - 34.0 pg   MCHC 33.5 30.0 - 36.0 g/dL   RDW 45.8 09.9 - 83.3 %   Platelets 292 150 - 400 K/uL   nRBC 0.0 0.0 - 0.2 %  Comprehensive metabolic panel   Collection Time: 06/01/20 12:39 PM  Result Value Ref Range   Sodium 134 (L) 135 - 145 mmol/L   Potassium 3.7 3.5 - 5.1 mmol/L   Chloride 107 98 - 111 mmol/L   CO2 18 (L) 22 - 32 mmol/L   Glucose, Bld 79 70 - 99 mg/dL   BUN 6 6 - 20 mg/dL   Creatinine, Ser 06/03/20 0.44 - 1.00 mg/dL   Calcium 8.2 (L) 8.9 - 10.3 mg/dL   Total Protein 6.4 (L) 6.5 -  8.1 g/dL   Albumin 2.6 (L) 3.5 - 5.0 g/dL   AST 13 (L) 15 - 41 U/L   ALT 11 0 - 44 U/L   Alkaline Phosphatase 135 (H) 38 - 126 U/L   Total Bilirubin 0.3 0.3 - 1.2 mg/dL   GFR, Estimated >45 >03 mL/min   Anion gap 9 5 - 15     Constitutional: NAD, AAOx3  HE/ENT: extraocular movements grossly intact, moist mucous membranes CV: RRR PULM: nl respiratory effort, CTABL     Abd: gravid, non-tender, non-distended, soft      Ext: Non-tender, Nonedmeatous   Psych: mood appropriate, speech normal Pelvic : deferred  Fetal Monitor: Baseline: 140 bpm, Variability: moderate, Accelerations: present and Decelerations: Absent Toco: occasional mild contraction  Category: 1   Assessment: 34 y.o. [redacted]w[redacted]d here for antenatal surveillance during pregnancy.  Principle diagnosis: Preeclampsia without severe features   Plan:  Labor: not present.   Fetal Wellbeing: Reassuring Cat 1 tracing.  Reactive NST   B/P's in  normal to mild range, no severe range blood pressures noted.  Preeclampsia labs normal but urine PCR 800  IOL planned for 37 weeks - 06/14/2020 at 0001  D/c home stable, precautions reviewed, follow-up as scheduled on Thursday.   ----- Margaretmary Eddy, CNM Certified Nurse Midwife [place]  [facility]

## 2020-06-01 NOTE — OB Triage Note (Signed)
Pt.  Is [redacted]w[redacted]d, G1P0, presents by EMS with complaints of increased BP and HR. Pt. Reports two days ago she started felling dizzy and began taking her own BP's. Pt. Reports recent blood pressures have been in the 130's/80s range. Today when pt. Took BP it was in the 140's/90's. EMS reported a BP on the truck of 160/110. Pt. Denies any headache, vision changes, and/or epigastric pain. Pt. Has bilateral lower extremity edema +2. VSS, pt reports no pain. Monitors applied, will continue to assess.

## 2020-06-03 LAB — OB RESULTS CONSOLE RPR: RPR: NONREACTIVE

## 2020-06-03 LAB — OB RESULTS CONSOLE GBS
GBS: NEGATIVE
GBS: NEGATIVE

## 2020-06-03 LAB — OB RESULTS CONSOLE GC/CHLAMYDIA
Chlamydia: NEGATIVE
Gonorrhea: NEGATIVE

## 2020-06-03 LAB — OB RESULTS CONSOLE HIV ANTIBODY (ROUTINE TESTING): HIV: NONREACTIVE

## 2020-06-10 ENCOUNTER — Encounter
Admission: RE | Admit: 2020-06-10 | Discharge: 2020-06-10 | Disposition: A | Discharge status: Home / Self Care | Payer: Managed Care, Other (non HMO) | Source: Ambulatory Visit | Attending: Obstetrics and Gynecology | Admitting: Obstetrics and Gynecology

## 2020-06-10 ENCOUNTER — Other Ambulatory Visit: Payer: Self-pay

## 2020-06-10 NOTE — Consult Note (Signed)
[facility] Anesthesia Consultation  Gailene Youkhana ATF:[id number] DOB: October 13, 1985 DOA: 06/10/2020 PCP: Christiansen, [place] Nicole, PA-C   Requesting physician: Suzy Bouchard, MD Date of consultation: 06/10/2020 Reason for consultation: Obesity during pregnancy  CHIEF COMPLAINT:  Obesity during pregnancy  HISTORY OF PRESENT ILLNESS: Rachel Shaw  is a 34 y.o. female G1P0 with a known history of GERD (controlled), PVCs (when anxious), morbid obesity, preeclampsia without severe features, presenting for pre-induction anesthesia evaluation due to obesity. She is not on antihypertensives. Her pregnancy otherwise has been uncomplicated. She denies any hx of blood dyscrasias or taking any blood thinners. She has had EGD and flexible sigmoidoscopy before without issues. Plan is for induction next week due to the preeclampsia. Patient is planning for an epidural.  PAST MEDICAL HISTORY:   Past Medical History:  Diagnosis Date  . GERD (gastroesophageal reflux disease)   . PVC (premature ventricular contraction)     PAST SURGICAL HISTORY: No past surgical history on file.  SOCIAL HISTORY:  Social History   Tobacco Use  . Smoking status: Never Smoker  . Smokeless tobacco: Never Used  Substance Use Topics  . Alcohol use: Not Currently    FAMILY HISTORY: No family history on file.  DRUG ALLERGIES:  Allergies  Allergen Reactions  . Azo [Phenazopyridine]     REVIEW OF SYSTEMS:   RESPIRATORY: No cough, shortness of breath, wheezing.  CARDIOVASCULAR: No chest pain, orthopnea, edema.  HEMATOLOGY: No anemia, easy bruising or bleeding SKIN: No rash or lesion. NEUROLOGIC: No tingling, numbness, weakness.  PSYCHIATRY: No anxiety or depression.   MEDICATIONS AT HOME:  Prior to Admission medications   Medication Sig Start Date End Date Taking? Authorizing Provider  aspirin 81 MG chewable tablet Chew 81 mg by mouth daily.    [provider]  cyanocobalamin (,VITAMIN B-12,) 1000 MCG/ML injection Inject into the muscle. 03/25/19   [provider]  ergocalciferol (VITAMIN D2) 1.25 MG (50000 UT) capsule  02/16/19   [provider]  omeprazole (PRILOSEC) 40 MG capsule Take 1 capsule (40 mg total) by mouth daily. 03/13/18 04/12/18  Nita Sickle, MD  Prenatal Vit-Fe Fumarate-FA (PRENATAL MULTIVITAMIN) TABS tablet Take 1 tablet by mouth daily at 12 noon.    [provider]      PHYSICAL EXAMINATION:   VITAL SIGNS: There were no vitals taken for this visit.  GENERAL:  34 y.o.-year-old obese patient no acute distress.  HEENT: Head atraumatic, normocephalic. Oropharynx and nasopharynx clear. MP 2, TM distance >3 cm, normal mouth opening, class I upper lip bite test LUNGS: No use of accessory muscles of respiration.   EXTREMITIES: No pedal edema, cyanosis, or clubbing.  NEUROLOGIC: normal gait PSYCHIATRIC: The patient is alert and oriented x 3.  SKIN: No obvious rash, lesion, or ulcer.     IMPRESSION AND PLAN:   Rachel Thursby  is a 34 y.o. female presenting with obesity during pregnancy. BMI is currently 51 at [redacted]w[redacted]d gestation.   We discussed analgesic options during labor including epidural analgesia. Discussed that in obesity there can be increased difficulty with epidural placement or even failure of successful epidural. We also discussed that even after successful epidural placement there is increased risk of catheter migration out of the epidural space that would require catheter replacement. Discussed use of epidural vs spinal vs GA if cesarean delivery is required. Discussed increased risk of difficult intubation during pregnancy should an emergency cesarean delivery be required.   Given reassuring airway, no history or symptoms suggestive  of sleep apnea, should be OK to labor here from anesthetic standpoint.

## 2020-06-11 ENCOUNTER — Other Ambulatory Visit
Admission: RE | Admit: 2020-06-11 | Discharge: 2020-06-11 | Disposition: A | Discharge status: Home / Self Care | Payer: Managed Care, Other (non HMO) | Source: Ambulatory Visit | Attending: Obstetrics and Gynecology | Admitting: Obstetrics and Gynecology

## 2020-06-11 DIAGNOSIS — Z01812 Encounter for preprocedural laboratory examination: Secondary | ICD-10-CM | POA: Insufficient documentation

## 2020-06-11 DIAGNOSIS — Z20822 Contact with and (suspected) exposure to covid-19: Secondary | ICD-10-CM | POA: Insufficient documentation

## 2020-06-11 LAB — SARS CORONAVIRUS 2 (TAT 6-24 HRS): SARS Coronavirus 2: NEGATIVE

## 2020-06-14 ENCOUNTER — Other Ambulatory Visit: Payer: Self-pay

## 2020-06-14 ENCOUNTER — Inpatient Hospital Stay: Payer: Managed Care, Other (non HMO) | Admitting: Anesthesiology

## 2020-06-14 ENCOUNTER — Encounter: Payer: Self-pay | Admitting: Obstetrics & Gynecology

## 2020-06-14 ENCOUNTER — Inpatient Hospital Stay
Admission: EM | Admit: 2020-06-14 | Discharge: 2020-06-17 | DRG: 806 | Disposition: A | Discharge status: Home / Self Care | Payer: Managed Care, Other (non HMO)

## 2020-06-14 DIAGNOSIS — E669 Obesity, unspecified: Secondary | ICD-10-CM | POA: Diagnosis present

## 2020-06-14 DIAGNOSIS — D62 Acute posthemorrhagic anemia: Secondary | ICD-10-CM | POA: Diagnosis not present

## 2020-06-14 DIAGNOSIS — O1404 Mild to moderate pre-eclampsia, complicating childbirth: Principal | ICD-10-CM | POA: Diagnosis present

## 2020-06-14 DIAGNOSIS — Z20822 Contact with and (suspected) exposure to covid-19: Secondary | ICD-10-CM | POA: Diagnosis present

## 2020-06-14 DIAGNOSIS — O1493 Unspecified pre-eclampsia, third trimester: Secondary | ICD-10-CM | POA: Diagnosis present

## 2020-06-14 DIAGNOSIS — R0602 Shortness of breath: Secondary | ICD-10-CM

## 2020-06-14 DIAGNOSIS — Z3A37 37 weeks gestation of pregnancy: Secondary | ICD-10-CM | POA: Diagnosis not present

## 2020-06-14 DIAGNOSIS — O9962 Diseases of the digestive system complicating childbirth: Secondary | ICD-10-CM | POA: Diagnosis present

## 2020-06-14 DIAGNOSIS — O9081 Anemia of the puerperium: Secondary | ICD-10-CM | POA: Diagnosis not present

## 2020-06-14 DIAGNOSIS — F419 Anxiety disorder, unspecified: Secondary | ICD-10-CM | POA: Diagnosis present

## 2020-06-14 DIAGNOSIS — O99214 Obesity complicating childbirth: Secondary | ICD-10-CM | POA: Diagnosis present

## 2020-06-14 DIAGNOSIS — O99344 Other mental disorders complicating childbirth: Secondary | ICD-10-CM | POA: Diagnosis present

## 2020-06-14 DIAGNOSIS — O0993 Supervision of high risk pregnancy, unspecified, third trimester: Secondary | ICD-10-CM

## 2020-06-14 DIAGNOSIS — K219 Gastro-esophageal reflux disease without esophagitis: Secondary | ICD-10-CM | POA: Diagnosis present

## 2020-06-14 HISTORY — DX: Essential (primary) hypertension: I10

## 2020-06-14 HISTORY — DX: Anxiety disorder, unspecified: F41.9

## 2020-06-14 LAB — COMPREHENSIVE METABOLIC PANEL
ALT: 13 U/L (ref 0–44)
AST: 15 U/L (ref 15–41)
Albumin: 2.8 g/dL — ABNORMAL LOW (ref 3.5–5.0)
Alkaline Phosphatase: 143 U/L — ABNORMAL HIGH (ref 38–126)
Anion gap: 8 (ref 5–15)
BUN: 11 mg/dL (ref 6–20)
CO2: 17 mmol/L — ABNORMAL LOW (ref 22–32)
Calcium: 8.2 mg/dL — ABNORMAL LOW (ref 8.9–10.3)
Chloride: 107 mmol/L (ref 98–111)
Creatinine, Ser: 0.63 mg/dL (ref 0.44–1.00)
GFR, Estimated: >60 mL/min (ref >60)
Glucose, Bld: 83 mg/dL (ref 70–99)
Potassium: 3.6 mmol/L (ref 3.5–5.1)
Sodium: 132 mmol/L — ABNORMAL LOW (ref 135–145)
Total Bilirubin: 0.6 mg/dL (ref 0.3–1.2)
Total Protein: 6.3 g/dL — ABNORMAL LOW (ref 6.5–8.1)

## 2020-06-14 LAB — TYPE AND SCREEN
ABO/RH(D): O POS
Antibody Screen: NEGATIVE

## 2020-06-14 LAB — CBC
HCT: 36.8 % (ref 36.0–46.0)
Hemoglobin: 12.2 g/dL (ref 12.0–15.0)
MCH: 26.5 pg (ref 26.0–34.0)
MCHC: 33.2 g/dL (ref 30.0–36.0)
MCV: 79.8 fL — ABNORMAL LOW (ref 80.0–100.0)
Platelets: 259 10*3/uL (ref 150–400)
RBC: 4.61 MIL/uL (ref 3.87–5.11)
RDW: 13.8 % (ref 11.5–15.5)
WBC: 11.6 10*3/uL — ABNORMAL HIGH (ref 4.0–10.5)
nRBC: 0 % (ref 0.0–0.2)

## 2020-06-14 LAB — PROTEIN / CREATININE RATIO, URINE
Creatinine, Urine: 354 mg/dL
Protein Creatinine Ratio: 0.17 mg/mg{Cre} — ABNORMAL HIGH (ref 0.00–0.15)
Total Protein, Urine: 61 mg/dL

## 2020-06-14 LAB — ABO/RH: ABO/RH(D): O POS

## 2020-06-14 LAB — RPR: RPR Ser Ql: NONREACTIVE

## 2020-06-14 MED ORDER — AMMONIA AROMATIC IN INHA
RESPIRATORY_TRACT | Status: AC
  Filled 2020-06-14: qty 10

## 2020-06-14 MED ORDER — SODIUM CHLORIDE 0.9 % IV SOLN: 250.0000 mL | INTRAVENOUS | Status: DC | PRN

## 2020-06-14 MED ORDER — LACTATED RINGERS IV SOLN: INTRAVENOUS | Status: DC

## 2020-06-14 MED ORDER — DIPHENHYDRAMINE HCL 50 MG/ML IJ SOLN: 12.5000 mg | INTRAMUSCULAR | Status: DC | PRN

## 2020-06-14 MED ORDER — EPHEDRINE 5 MG/ML INJ: 10.0000 mg | INTRAVENOUS | Status: DC | PRN

## 2020-06-14 MED ORDER — BENZOCAINE-MENTHOL 20-0.5 % EX AERO: 1.0000 "application " | INHALATION_SPRAY | CUTANEOUS | Status: DC | PRN

## 2020-06-14 MED ORDER — LIDOCAINE-EPINEPHRINE (PF) 1.5 %-1:200000 IJ SOLN
INTRAMUSCULAR | Status: DC | PRN
  Administered 2020-06-14: 3 mL via EPIDURAL

## 2020-06-14 MED ORDER — PHENYLEPHRINE 40 MCG/ML (10ML) SYRINGE FOR IV PUSH (FOR BLOOD PRESSURE SUPPORT): 80.0000 ug | PREFILLED_SYRINGE | INTRAVENOUS | Status: DC | PRN

## 2020-06-14 MED ORDER — BUTORPHANOL TARTRATE 1 MG/ML IJ SOLN: 1.0000 mg | INTRAMUSCULAR | Status: DC | PRN

## 2020-06-14 MED ORDER — FENTANYL 2.5 MCG/ML W/ROPIVACAINE 0.15% IN NS 100 ML EPIDURAL (ARMC)
EPIDURAL | Status: DC | PRN
  Administered 2020-06-14: 12 mL/h via EPIDURAL

## 2020-06-14 MED ORDER — MISOPROSTOL 25 MCG QUARTER TABLET
25.0000 ug | ORAL_TABLET | ORAL | Status: DC | PRN
  Administered 2020-06-14: 01:00:00 25 ug via BUCCAL
  Filled 2020-06-14: qty 1

## 2020-06-14 MED ORDER — FENTANYL 2.5 MCG/ML W/ROPIVACAINE 0.15% IN NS 100 ML EPIDURAL (ARMC)
EPIDURAL | Status: AC
  Filled 2020-06-14: qty 100

## 2020-06-14 MED ORDER — ACETAMINOPHEN 500 MG PO TABS: 1000.0000 mg | ORAL_TABLET | Freq: Four times a day (QID) | ORAL | Status: DC | PRN

## 2020-06-14 MED ORDER — LACTATED RINGERS IV SOLN
500.0000 mL | INTRAVENOUS | Status: DC | PRN
  Administered 2020-06-14: 08:00:00 750 mL via INTRAVENOUS
  Administered 2020-06-14 (×2): 500 mL via INTRAVENOUS

## 2020-06-14 MED ORDER — DIPHENHYDRAMINE HCL 50 MG/ML IJ SOLN
25.0000 mg | Freq: Once | INTRAMUSCULAR | Status: AC
  Administered 2020-06-14: 11:00:00 25 mg via INTRAVENOUS
  Filled 2020-06-14: qty 1

## 2020-06-14 MED ORDER — LIDOCAINE HCL (PF) 1 % IJ SOLN: 30.0000 mL | INTRAMUSCULAR | Status: DC | PRN

## 2020-06-14 MED ORDER — DIPHENHYDRAMINE HCL 50 MG/ML IJ SOLN
INTRAMUSCULAR | Status: AC
  Filled 2020-06-14: qty 1

## 2020-06-14 MED ORDER — FENTANYL 2.5 MCG/ML W/ROPIVACAINE 0.15% IN NS 100 ML EPIDURAL (ARMC)
12.0000 mL/h | EPIDURAL | Status: DC
  Administered 2020-06-14: 14:00:00 12 mL/h via EPIDURAL
  Filled 2020-06-14: qty 100

## 2020-06-14 MED ORDER — ONDANSETRON HCL 4 MG/2ML IJ SOLN: 4.0000 mg | Freq: Four times a day (QID) | INTRAMUSCULAR | Status: DC | PRN

## 2020-06-14 MED ORDER — SODIUM CHLORIDE 0.9 % IV SOLN
INTRAVENOUS | Status: DC | PRN
  Administered 2020-06-14 (×2): 5 mL via EPIDURAL

## 2020-06-14 MED ORDER — TERBUTALINE SULFATE 1 MG/ML IJ SOLN
0.2500 mg | Freq: Once | INTRAMUSCULAR | Status: AC | PRN
  Administered 2020-06-14: 04:00:00 0.25 mg via SUBCUTANEOUS

## 2020-06-14 MED ORDER — OXYTOCIN-SODIUM CHLORIDE 30-0.9 UT/500ML-% IV SOLN
2.5000 [IU]/h | INTRAVENOUS | Status: DC
  Administered 2020-06-14 (×2): 2.5 [IU]/h via INTRAVENOUS
  Filled 2020-06-14: qty 500

## 2020-06-14 MED ORDER — LIDOCAINE HCL (PF) 1 % IJ SOLN
INTRAMUSCULAR | Status: AC
  Filled 2020-06-14: qty 30

## 2020-06-14 MED ORDER — MISOPROSTOL 200 MCG PO TABS
ORAL_TABLET | ORAL | Status: AC
  Filled 2020-06-14: qty 4

## 2020-06-14 MED ORDER — SODIUM CHLORIDE 0.9% FLUSH: 3.0000 mL | INTRAVENOUS | Status: DC | PRN

## 2020-06-14 MED ORDER — SODIUM CHLORIDE 0.9% FLUSH: 3.0000 mL | Freq: Two times a day (BID) | INTRAVENOUS | Status: DC

## 2020-06-14 MED ORDER — MISOPROSTOL 25 MCG QUARTER TABLET
25.0000 ug | ORAL_TABLET | ORAL | Status: DC | PRN
  Administered 2020-06-14: 01:00:00 25 ug via VAGINAL
  Filled 2020-06-14: qty 1

## 2020-06-14 MED ORDER — OXYTOCIN-SODIUM CHLORIDE 30-0.9 UT/500ML-% IV SOLN
1.0000 m[IU]/min | INTRAVENOUS | Status: DC
  Administered 2020-06-14 (×2): 2 m[IU]/min via INTRAVENOUS
  Filled 2020-06-14: qty 500

## 2020-06-14 MED ORDER — OXYTOCIN 10 UNIT/ML IJ SOLN
INTRAMUSCULAR | Status: AC
  Filled 2020-06-14: qty 2

## 2020-06-14 MED ORDER — DIPHENHYDRAMINE HCL 50 MG/ML IJ SOLN
50.0000 mg | Freq: Once | INTRAMUSCULAR | Status: AC
  Administered 2020-06-14: 19:00:00 50 mg via INTRAVENOUS

## 2020-06-14 MED ORDER — TERBUTALINE SULFATE 1 MG/ML IJ SOLN
INTRAMUSCULAR | Status: AC
  Filled 2020-06-14: qty 1

## 2020-06-14 MED ORDER — IBUPROFEN 600 MG PO TABS
600.0000 mg | ORAL_TABLET | Freq: Four times a day (QID) | ORAL | Status: DC
Start: 2020-06-15 — End: 2020-06-15
  Administered 2020-06-15: 01:00:00 600 mg via ORAL
  Filled 2020-06-14: qty 1

## 2020-06-14 MED ORDER — LACTATED RINGERS IV SOLN: 500.0000 mL | Freq: Once | INTRAVENOUS | Status: AC

## 2020-06-14 MED ORDER — LIDOCAINE HCL (PF) 1 % IJ SOLN
INTRAMUSCULAR | Status: DC | PRN
  Administered 2020-06-14: 4 mL via SUBCUTANEOUS

## 2020-06-14 MED ORDER — OXYTOCIN BOLUS FROM INFUSION
333.0000 mL | Freq: Once | INTRAVENOUS | Status: AC
  Administered 2020-06-14: 21:00:00 333 mL via INTRAVENOUS

## 2020-06-14 NOTE — Discharge Summary (Signed)
Obstetrical Discharge Summary  Patient Name: Rachel Shaw DOB: 10-29-1985 MRN: [id number]  Date of Admission: 06/14/2020 Date of Delivery: 06/14/2020 Delivered by: Margaretmary Eddy, CNM  Date of Discharge: 06/17/2020  Primary OB: [facility] [id number] last menstrual period was 09/12/2019. EDC Estimated Date of Delivery: 07/03/20 Gestational Age at Delivery: [redacted]w[redacted]d   Antepartum complications:  1. Preeclampsia without severe features  2. Obesity in pregnancy - Prepregnancy BMI 49.6 3. History of childhood seizures and possible intracranial idiopathic hypertension   Admitting Diagnosis: IOL for preeclampsia without severe features  Secondary Diagnosis: Patient Active Problem List   Diagnosis Date Noted  . NSVD (normal spontaneous vaginal delivery) 06/17/2020  . Preeclampsia, third trimester 06/14/2020  . 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 Vitamin B12 deficiency 03/14/2019  . Vitamin D deficiency 03/14/2019  . 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 Generalized anxiety disorder 07/12/2018  . Obesity 10/27/2013  . Hyperlipidemia 10/27/2013  . Migraine headache without aura 03/03/2013    Induction: AROM, Pitocin, Cytotec and IP Foley Complications: None Intrapartum complications/course: Rachel presented to L&D for scheduled IOL for preeclampsia.  She had one dose of misoprostol but started to have recurrent late decelerations.  Cervical balloon was placed and was quickly expelled.  Internal monitors were placed after AROM and oxytocin was initiated.  She progressed to C/C/+2 with a spontaneous urge to push.  Rachel had excellent maternal effort and pushed well over 30 minutes.   Delivery Type: spontaneous vaginal delivery Anesthesia: epidural Placenta: spontaneous Laceration: left sulcal, 1st degree vaginal, and periurethral laceration with repair  Episiotomy: none Newborn Data: Live born female "[name]" Birth Weight:  3040g   6lb11.2oz APGAR: 7, 9  Newborn Delivery   Birth date/time: 06/14/2020 21:18:00 Delivery type: Vaginal, Spontaneous     Postpartum Procedures: none Edinburgh:  Edinburgh Postnatal Depression Scale Screening Tool 06/15/2020  I have been able to laugh and see the funny side of things. 0  I have looked forward with enjoyment to things. 0  I have blamed myself unnecessarily when things went wrong. 1  I have been anxious or worried for no good reason. 2  I have felt scared or panicky for no good reason. 1  Things have been getting on top of me. 1  I have been so unhappy that I have had difficulty sleeping. 0  I have felt sad or miserable. 0  I have been so unhappy that I have been crying. 0  The thought of harming myself has occurred to me. 0  Edinburgh Postnatal Depression Scale Total 5     Post partum course:  Patient's postpartum course was complicated by elevated BP.  By time of discharge on PPD#3, her pain was controlled on oral pain medications; she had appropriate lochia and was ambulating, voiding without difficulty and tolerating regular diet.  She was deemed stable for discharge to home.    Discharge Physical Exam:  BP 139/86 (BP Location: Right Arm)   Pulse 91   Temp 98.3 F (36.8 C) (Oral)   Resp 18   Ht 5\' 10"  (1.778 m)   Wt (!) 170.6 kg   LMP 09/12/2019   SpO2 98%   Breastfeeding Unknown   BMI 53.95 kg/m   Vitals with BMI 06/17/2020 06/17/2020 06/17/2020  Height - - -  Weight - - -  BMI - - -  Systolic 139 139 06/19/2020  Diastolic 86 92 81  Pulse 91 - 82    General: NAD CV: RRR Pulm: nl effort ABD: s/nd/nt,  fundus firm and below the umbilicus Lochia: moderate Perineum:minimal edema/repair well approximated DVT Evaluation: LE non-ttp, no evidence of DVT on exam.  Hemoglobin  Date Value Ref Range Status  06/17/2020 11.0 (L) 12.0 - 15.0 g/dL Final   HCT  Date Value Ref Range Status  06/17/2020 33.5 (L) 36.0 - 46.0 % Final     Disposition: stable,  discharge to home. Baby Feeding: breastmilk Baby Disposition: home with mom  Rh Immune globulin given: Rh pos Rubella vaccine given: Immune Varivax vaccine given: Immune Flu vaccine given in AP or PP setting: 03/19/2020 Tdap vaccine given in AP or PP setting: 04/21/2020  Contraception: Partner considering vasectomy   Prenatal Labs:  Blood type/Rh O pos  Antibody screen neg  Rubella Immune  Varicella Immune  RPR NR  HBsAg Neg  HIV NR  GC neg  Chlamydia neg  Genetic screening negative  1 hour GTT 125  3 hour GTT N/A  GBS Neg   Plan:  Rachel Shaw was discharged to home in good condition. Follow-up appointment 3-5 days for blood pressure check and with delivering provider in 6 weeks.  Discharge Medications: Allergies as of 06/17/2020      Reactions   Azo [phenazopyridine]       Medication List    STOP taking these medications   cyanocobalamin 1000 MCG/ML injection Commonly known as: (VITAMIN B-12)   ergocalciferol 1.25 MG (50000 UT) capsule Commonly known as: VITAMIN D2     TAKE these medications   escitalopram 20 MG tablet Commonly known as: Lexapro Take 1 tablet (20 mg total) by mouth daily.   NIFEdipine 30 MG 24 hr tablet Commonly known as: PROCARDIA-XL/NIFEDICAL-XL Take 2 tablets (60 mg total) by mouth daily. Start taking on: June 18, 2020   omeprazole 40 MG capsule Commonly known as: PriLOSEC Take 1 capsule (40 mg total) by mouth daily.   prenatal multivitamin Tabs tablet Take 1 tablet by mouth daily at 12 noon.        Follow-up Information    Gustavo Lah, CNM. Schedule an appointment as soon as possible for a visit in 6 week(s).   Specialty: Certified Nurse Midwife Why: postpartm visit  Contact information: [address] [place] [address] [phone]        [facility]. Schedule an appointment as soon as possible for a visit in 3 day(s).   Why: Please call to schedule a follow up appointment in 3 day for  a blood pressure check Contact information: [address] [place] [address] [phone]              Signed: Cyril Mourning 06/17/2020 9:17 PM

## 2020-06-14 NOTE — Progress Notes (Signed)
Labor Progress Note  Rachel Shaw is a 34 y.o. G1P0 at [redacted]w[redacted]d by ultrasound admitted for induction of labor due to preeclampsia without severe features.  Subjective: feeling intermittent pressure but overall comfortable with epidural  Objective: BP (!) 135/96   Pulse 70   Temp 98.5 F (36.9 C) (Oral)   Resp 16   Ht 5\' 10"  (1.778 m)   Wt (!) 170.6 kg   SpO2 100%   BMI 53.95 kg/m  Notable VS details: reviewed, normal to mild range b/p's   Fetal Assessment: FHT:  FHR: 155 bpm, variability: moderate,  accelerations:  Present,  decelerations:  Present late Category/reactivity:  Category II UC:   regular, every 2-5 minutes, MVU's >200 SVE:   5-6/80/-1/swollen cervix  Membrane status: AROM at 0810 Amniotic color: meconium  Labs: Lab Results  Component Value Date   WBC 11.6 (H) 06/14/2020   HGB 12.2 06/14/2020   HCT 36.8 06/14/2020   MCV 79.8 (L) 06/14/2020   PLT 259 06/14/2020    Assessment / Plan: Protracted latent phase  -minimal to no cervical change since exam this morning -s/p benadryl for swollen cervix - still feels swollen -molding and caput noted with exam -pelvis feels adequate but fetal head not descending despite adequate contractions  -Will continue with oxytocin augmentation -Attempt different positioning to help with engagement of fetal head  -Discussed that cesarean birth may be needed if cervix remains unchanged despite efforts  -Dr. 06/16/2020 updated on exam and interventions   Labor: protracted latent phase, will continue with augmentation with oxytocin and positioning techniques  Preeclampsia:  no signs or symptoms of toxicity, intake and ouput balanced and labs stable Fetal Wellbeing:  Category II - overall reassuring with moderate variability, accels, and fetal scalp stim  Pain Control:  Epidural I/D:  GBS neg, afebrile  Elesa Massed, CNM 06/14/2020, 1:30 PM

## 2020-06-14 NOTE — Progress Notes (Signed)
Labor Progress Note  Rachel Shaw is a 34 y.o. G1P0 at [redacted]w[redacted]d by ultrasound admitted for induction of labor due to preeclampsia without severe features.  Subjective: comfortable after epidural  Objective: BP 108/60 (BP Location: Right Arm)   Pulse 86   Temp 98 F (36.7 C) (Oral)   Resp 16   Ht 5\' 10"  (1.778 m)   Wt (!) 170.6 kg   SpO2 98%   BMI 53.95 kg/m  Notable VS details: reviewed, no severe range b/p's noted   Fetal Assessment: FHT:  FHR: 145 bpm, variability: moderate,  accelerations:  Present,  decelerations:  Present lates, prolong   -Prolong decel noted after AROM and internal monitors placed.  Resolved spontaneously with position changes  Category/reactivity:  Category II UC:   regular, every 3-5 minutes SVE:   5/90/-2/soft/ant Membrane status: AROM at 0810 Amniotic color: meconium   Labs: Lab Results  Component Value Date   WBC 11.6 (H) 06/14/2020   HGB 12.2 06/14/2020   HCT 36.8 06/14/2020   MCV 79.8 (L) 06/14/2020   PLT 259 06/14/2020    Assessment / Plan: Induction of labor d/t preeclampsia, s/p 1 dose of misoprostol  -Progressing normally -Dr. 06/16/2020 at bedside, reviewed EFM -Will continue to monitor closely  Labor: Progressing normally Preeclampsia:  intake and ouput balanced, labs stable and b/p stable Fetal Wellbeing:  Category II - overall reassuring with moderate variability Pain Control:  Epidural I/D:  GBS neg   Elesa Massed, CNM 06/14/2020, 8:34 AM

## 2020-06-14 NOTE — Progress Notes (Signed)
Labor Progress Note  Rachel Shaw is a 34 y.o. G1P0 at [redacted]w[redacted]d by ultrasound admitted for induction of labor due to preeclampsia.  Subjective: feeling more pressure with contractions   Objective: BP (!) 144/89   Pulse 81   Temp 98.6 F (37 C) (Oral)   Resp 20   Ht 5\' 10"  (1.778 m)   Wt (!) 170.6 kg   SpO2 97%   BMI 53.95 kg/m  Notable VS details: reviewed, normal to mild range blood pressures   Fetal Assessment: FHT:  FHR: 150 bpm, variability: moderate,  accelerations:  Present,  decelerations:  Present intermittent lates Category/reactivity:  Category II UC:   regular, every 2-3 minutes, MVU's consistently >200 SVE:   9/80/+1 Membrane status: AROM at 0810 Amniotic color: meconium   Labs: Lab Results  Component Value Date   WBC 11.6 (H) 06/14/2020   HGB 12.2 06/14/2020   HCT 36.8 06/14/2020   MCV 79.8 (L) 06/14/2020   PLT 259 06/14/2020    Assessment / Plan: Induction of labor due to preeclampsia,  progressing well on pitocin  -Cervix feels swollen on exam, most of the cervix is towards the anterior portion  -Will repeat 25mg  of IV benadryl  -Continue supportive positions to help with further descent  -Dr. 06/16/2020 updated on exam and tracing   Labor: Progressing normally  Preeclampsia:  no signs or symptoms of toxicity, intake and ouput balanced and labs stable Fetal Wellbeing:  Category II - overall reassuring with moderate variability and accels  Pain Control:  Epidural I/D:  GBS neg, afebrile  , CNM 06/14/2020, 6:31 PM

## 2020-06-14 NOTE — Progress Notes (Signed)
Patient has had late decelerations despite interventions done (position changes, fluid bolus). Tami Lin, CNM and Ward, MD aware and have reviewed FHR strip. No new orders given at this time. Will continue to monitor.

## 2020-06-14 NOTE — Progress Notes (Signed)
Labor Progress Note  Ana-Linn Huesca is a 34 y.o. G1P0 at [redacted]w[redacted]d by ultrasound admitted for induction of labor due to preeclampsia. -Called to bedside d/t FHR decelerations   Subjective: feeling more pressure with contractions   Objective: BP 129/63 (BP Location: Right Arm)   Pulse 78   Temp 98.6 F (37 C) (Oral)   Resp 20   Ht 5\' 10"  (1.778 m)   Wt (!) 170.6 kg   SpO2 98%   BMI 53.95 kg/m  Notable VS details: reviewed, normal to mild range blood pressures   Fetal Assessment: FHT:  FHR: 155 bpm, variability: moderate,  accelerations:  Present,  decelerations:  Present lates and prolong Category/reactivity:  Category II UC:   regular, every 2-5 minutes, MVU's adequate SVE:   8/80/0 Membrane status: AROM at 0810 Amniotic color: meconium   Labs: Lab Results  Component Value Date   WBC 11.6 (H) 06/14/2020   HGB 12.2 06/14/2020   HCT 36.8 06/14/2020   MCV 79.8 (L) 06/14/2020   PLT 259 06/14/2020    Assessment / Plan: Induction of labor due to preeclampsia,  progressing well on pitocin -Dr. 06/16/2020 updated on tracing and cervical change   Labor: progressing well on pitocin  Preeclampsia:  no signs or symptoms of toxicity, intake and ouput balanced and labs stable Fetal Wellbeing:  Category II - prolong decel and recurrent deep lates noted during Hands and Knees.  Pitocin turned off, patient repositioned, fluid bolus given.  FHR decelerations improved with intrauterine resuscitation measures.  Will continue to monitor closely.   Pain Control:  Epidural I/D:  GBS neg, afebrile  Elesa Massed, CNM 06/14/2020, 4:13 PM

## 2020-06-14 NOTE — Progress Notes (Signed)
NST  Baseline: 145 Variability: moderate Accelerations present x >2 Decelerations present late 2 hours after misoprostol placed  Conservative measures applied for fetal resuscitation,

## 2020-06-14 NOTE — Anesthesia Preprocedure Evaluation (Signed)
Anesthesia Evaluation  Patient identified by MRN, date of birth, ID band Patient awake    Reviewed: Allergy & Precautions, H&P , NPO status , Patient's Chart, lab work & pertinent test results, reviewed documented beta blocker date and time   History of Anesthesia Complications Negative for: history of anesthetic complications  Airway Mallampati: I  TM Distance: >3 FB Neck ROM: full    Dental  (+) Dental Advidsory Given   Pulmonary neg pulmonary ROS,    Pulmonary exam normal breath sounds clear to auscultation       Cardiovascular Exercise Tolerance: Good hypertension, (-) anginaNormal cardiovascular exam+ dysrhythmias (-) Valvular Problems/Murmurs Rhythm:regular Rate:Normal     Neuro/Psych  Headaches, Seizures - (as a child, none in many years),  PSYCHIATRIC DISORDERS Anxiety    GI/Hepatic Neg liver ROS, GERD  ,  Endo/Other  diabetes (Pre-diabetic)Morbid obesity  Renal/GU negative Renal ROS  negative genitourinary   Musculoskeletal   Abdominal   Peds  Hematology negative hematology ROS (+)   Anesthesia Other Findings Past Medical History: No date: Anxiety No date: GERD (gastroesophageal reflux disease) No date: Hypertension No date: PVC (premature ventricular contraction)   Reproductive/Obstetrics (+) Pregnancy                             Anesthesia Physical Anesthesia Plan  ASA: III  Anesthesia Plan: Epidural   Post-op Pain Management:    Induction:   PONV Risk Score and Plan:   Airway Management Planned:   Additional Equipment:   Intra-op Plan:   Post-operative Plan:   Informed Consent: I have reviewed the patients History and Physical, chart, labs and discussed the procedure including the risks, benefits and alternatives for the proposed anesthesia with the patient or authorized representative who has indicated his/her understanding and acceptance.     Dental  Advisory Given  Plan Discussed with: Anesthesiologist, CRNA and Surgeon  Anesthesia Plan Comments:         Anesthesia Quick Evaluation

## 2020-06-14 NOTE — Progress Notes (Signed)
At 1515 RN moved patient to hands and knees position with help of another RN after patient being in side lying release on both left and right sides. After patient in hands and knees, FHR baseline came up to the 170-180s with repetitive late decelerations.  At 1524 CNM notified of late decelerationss. At 1540 RN notified [facility] of late and prolonged decelerations with FHR baseline 170-180s and asked provider to review FHR strip. At 1543, 2 RNs at bedside to reposition patient in low fowlers. At 1546 RN began 500 mL IV fluid bolus and Mackie CNM gave verbal order to discontinue pitocin. RN discontinued pitocin at 1548 and performed sterile vaginal exam. On exam, patient was 7.5/90/0,+1. CNM notified and states she is on her way to examine patient. CNM at bedside at 1601 to perform sterile vaginal exam and states that the patient was 8/80/0,+1. Provider reviewed FHR strip and educated family and patient on plan of care. No other questions or concerns by family. Will continue to monitor.

## 2020-06-14 NOTE — Progress Notes (Signed)
Into patient's room for evaluation with decel. Continued mod variability with +fetal scalp stim at 10:32. Cervix slightly edematous, though, without cervical change. Minimal fetal scalp molding at -1. Cat II strip with late decels intermittently, managed with conservative measures.   - plan for 25mg  iv benadryl for cervical edema - continued intrauterine fetal resuscitation.

## 2020-06-14 NOTE — H&P (Signed)
OB ADMISSION/ HISTORY & PHYSICAL:  Admission Date: 06/14/2020 12:11 AM  Admit Diagnosis: PreE  Rachel Shaw is a 34 y.o. G1P0 presenting for iol for PreE without severe features.  Prenatal History: G1P0   EDC : 07/03/2020,  LMP of 09/12/2019, not c/w early [place] at [redacted]w[redacted]d. Prenatal care at [facility] Prenatal course complicated by  1. Preeclampsia without severe features  2. Obesity in pregnancy - BMI 54 on admission, s/p anesthesia consult in pregnancy x2 3. Uterine S>D - EFW on 05/20/20 2582g (63%) 4. 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 Anxiety   . GERD (gastroesophageal reflux disease)   . Hypertension   . PVC (premature ventricular contraction)      Past surgical history:  Past Surgical History:  Procedure Laterality Date  . NO PAST SURGERIES      Family History:  Family History  Problem Relation Age of Onset  . Anxiety disorder Mother   . Diabetes Father   . Cancer Father      Social History:  reports that she has never smoked. She has never used smokeless tobacco. She reports previous alcohol use. She reports previous drug use.   Allergies: Azo [phenazopyridine]    Current Medications at time of admission:  Prior to Admission medications   Medication Sig Start Date End Date Taking? Authorizing Provider  aspirin 81 MG chewable tablet Chew 81 mg by mouth daily.   Yes [provider]  ergocalciferol (VITAMIN D2) 1.25 MG (50000 UT) capsule  02/16/19  Yes [provider]  Prenatal Vit-Fe Fumarate-FA (PRENATAL MULTIVITAMIN) TABS tablet Take 1 tablet by mouth daily at 12 noon.   Yes [provider]  cyanocobalamin (,VITAMIN B-12,) 1000 MCG/ML injection Inject into the muscle. Patient not taking: Reported on 06/14/2020 03/25/19   [provider]  omeprazole (PRILOSEC) 40 MG capsule Take 1 capsule (40 mg  total) by mouth daily. 03/13/18 04/12/18  Nita Sickle, MD     Review of Systems: Active FM No HA, visual changes  Physical Exam:  VS: Blood pressure 138/84, pulse 77, height 5\' 10"  (1.778 m), weight (!) 170.6 kg.  General: alert and oriented, appears nad Heart: RRR Lungs: Clear lung fields Abdomen: Gravid, soft and non-tender, non-distended Extremities: trace edema  FHT: 135, moderate variability, +accels, no decels on admission, but after misoprostol several prolonged lates  SVE: On admission, 1.5 cm, 50% effaced Now, after misoprostol  Dilation: 4 / Effacement (%): 70 / Station: -2    Cephalic by sutures  Prenatal Labs: Blood type/Rh --/--/O POS Performed at [facility], [address] [place], [place]  (12/27 0235)  Antibody screen neg  Rubella Immune  Varicella Immune  RPR NR  HBsAg Neg  HIV NR  GC neg  Chlamydia neg  Genetic screening negative  1 hour GTT 125  3 hour GTT n/a  GBS neg    Assessment: [redacted]w[redacted]d weeks gestation FHR category 2   Plan:    Admit for induction labor Labs pending Epidural when desired Continuous fetal monitoring   1. Fetal Well being  - Fetal Tracing: 2 - Ultrasound:  reviewed, as above - Group B Streptococcus: neg - Presentation: vtx confirmed   2. Routine OB: - Prenatal labs reviewed, as above - Rh pos  3. Post Partum Planning: Tdap: 04/21/2020 Flu: 03/19/2020 Contraception: TBD Feeding preference: TBD  4. Induction of  Labor:  -  Contractions external toco in place -  Plan for induction with misoprostol. However, fetal lates with terb given. Painful contractions, rapid cervical dilation with frequent contractions. Cook cath placed and expelled shortly. -pain significant -will get epidural and reasses. Plan for pitocin if needed, AROM when clinically appropriate

## 2020-06-14 NOTE — Progress Notes (Signed)
Patient has had late decelerations, with moderate variability and accelerations. CNM on unit and has been notified of position changes. CNM reviewed FHR strip and performed cervical exam. No new orders given at this time.

## 2020-06-14 NOTE — Anesthesia Procedure Notes (Signed)
Epidural Patient location during procedure: OB Start time: 06/14/2020 5:21 AM End time: 06/14/2020 5:32 AM  Staffing Anesthesiologist: Lenard Simmer, MD Performed: anesthesiologist   Preanesthetic Checklist Completed: patient identified, IV checked, site marked, risks and benefits discussed, surgical consent, monitors and equipment checked, pre-op evaluation and timeout performed  Epidural Patient position: sitting Prep: ChloraPrep Patient monitoring: heart rate, continuous pulse ox and blood pressure Approach: midline Location: L3-L4 Injection technique: LOR saline  Needle:  Needle type: Tuohy  Needle gauge: 17 G Needle length: 9 cm and 9 Needle insertion depth: 10 cm Catheter type: closed end flexible Catheter size: 19 Gauge Catheter at skin depth: 15 cm Test dose: negative and 1.5% lidocaine with Epi 1:200 K  Assessment Sensory level: T10 Events: blood not aspirated, injection not painful, no injection resistance, no paresthesia and negative IV test  Additional Notes 1st attempt Pt. Evaluated and documentation done after procedure finished. Patient identified. Risks/Benefits/Options discussed with patient including but not limited to bleeding, infection, nerve damage, paralysis, failed block, incomplete pain control, headache, blood pressure changes, nausea, vomiting, reactions to medication both or allergic, itching and postpartum back pain. Confirmed with bedside nurse the patient's most recent platelet count. Confirmed with patient that they are not currently taking any anticoagulation, have any bleeding history or any family history of bleeding disorders. Patient expressed understanding and wished to proceed. All questions were answered. Sterile technique was used throughout the entire procedure. Please see nursing notes for vital signs. Test dose was given through epidural catheter and negative prior to continuing to dose epidural or start infusion. Warning signs of high  block given to the patient including shortness of breath, tingling/numbness in hands, complete motor block, or any concerning symptoms with instructions to call for help. Patient was given instructions on fall risk and not to get out of bed. All questions and concerns addressed with instructions to call with any issues or inadequate analgesia.   Patient tolerated the insertion well without immediate complications.Reason for block:procedure for pain

## 2020-06-15 ENCOUNTER — Other Ambulatory Visit: Admission: RE | Admit: 2020-06-15 | Payer: Managed Care, Other (non HMO) | Source: Ambulatory Visit

## 2020-06-15 LAB — CBC
HCT: 33.1 % — ABNORMAL LOW (ref 36.0–46.0)
Hemoglobin: 11.4 g/dL — ABNORMAL LOW (ref 12.0–15.0)
MCH: 27.1 pg (ref 26.0–34.0)
MCHC: 34.4 g/dL (ref 30.0–36.0)
MCV: 78.8 fL — ABNORMAL LOW (ref 80.0–100.0)
Platelets: 206 10*3/uL (ref 150–400)
RBC: 4.2 MIL/uL (ref 3.87–5.11)
RDW: 14.2 % (ref 11.5–15.5)
WBC: 19 10*3/uL — ABNORMAL HIGH (ref 4.0–10.5)
nRBC: 0 % (ref 0.0–0.2)

## 2020-06-15 MED ORDER — PRENATAL MULTIVITAMIN CH
1.0000 | ORAL_TABLET | Freq: Every day | ORAL | Status: DC
  Administered 2020-06-15 – 2020-06-17 (×3): 1 via ORAL
  Filled 2020-06-15 (×3): qty 1

## 2020-06-15 MED ORDER — IBUPROFEN 600 MG PO TABS
600.0000 mg | ORAL_TABLET | Freq: Four times a day (QID) | ORAL | Status: DC
  Administered 2020-06-15 (×2): 600 mg via ORAL
  Filled 2020-06-15 (×3): qty 1

## 2020-06-15 MED ORDER — COCONUT OIL OIL
1.0000 "application " | TOPICAL_OIL | Status: DC | PRN
  Administered 2020-06-15: 1 via TOPICAL
  Filled 2020-06-15: qty 120

## 2020-06-15 MED ORDER — ONDANSETRON HCL 4 MG/2ML IJ SOLN: 4.0000 mg | INTRAMUSCULAR | Status: DC | PRN

## 2020-06-15 MED ORDER — DOCUSATE SODIUM 100 MG PO CAPS: 100.0000 mg | ORAL_CAPSULE | Freq: Two times a day (BID) | ORAL | Status: DC

## 2020-06-15 MED ORDER — WITCH HAZEL-GLYCERIN EX PADS
1.0000 "application " | MEDICATED_PAD | CUTANEOUS | Status: DC
  Administered 2020-06-15: 1 via TOPICAL
  Filled 2020-06-15: qty 100

## 2020-06-15 MED ORDER — DOCUSATE SODIUM 100 MG PO CAPS
100.0000 mg | ORAL_CAPSULE | Freq: Two times a day (BID) | ORAL | Status: DC
  Administered 2020-06-15 – 2020-06-17 (×2): 100 mg via ORAL
  Filled 2020-06-15 (×4): qty 1

## 2020-06-15 MED ORDER — DIBUCAINE (PERIANAL) 1 % EX OINT: 1.0000 "application " | TOPICAL_OINTMENT | CUTANEOUS | Status: DC | PRN

## 2020-06-15 MED ORDER — ONDANSETRON HCL 4 MG PO TABS: 4.0000 mg | ORAL_TABLET | ORAL | Status: DC | PRN

## 2020-06-15 MED ORDER — DIPHENHYDRAMINE HCL 25 MG PO CAPS: 25.0000 mg | ORAL_CAPSULE | Freq: Four times a day (QID) | ORAL | Status: DC | PRN

## 2020-06-15 MED ORDER — SIMETHICONE 80 MG PO CHEW: 80.0000 mg | CHEWABLE_TABLET | ORAL | Status: DC | PRN

## 2020-06-15 NOTE — Anesthesia Postprocedure Evaluation (Signed)
Anesthesia Post Note  Patient: Rachel Shaw  Procedure(s) Performed: AN AD HOC LABOR EPIDURAL  Patient location during evaluation: Mother Baby Anesthesia Type: Epidural Level of consciousness: awake and alert and oriented Pain management: pain level controlled Vital Signs Assessment: post-procedure vital signs reviewed and stable Respiratory status: spontaneous breathing Cardiovascular status: stable Postop Assessment: no headache, no backache, epidural receding, patient able to bend at knees, adequate PO intake, able to ambulate and no apparent nausea or vomiting Anesthetic complications: no   No complications documented.   Last Vitals:  Vitals:   06/15/20 0135 06/15/20 0332  BP: 123/78 118/77  Pulse: 82 86  Resp: 20 20  Temp: 37.1 C 36.9 C  SpO2: 96% 96%    Last Pain:  Vitals:   06/15/20 0332  TempSrc: Oral  PainSc:                  Zachary George

## 2020-06-15 NOTE — Progress Notes (Signed)
Post Partum Day 1  Subjective: Doing well, no concerns. Ambulating without difficulty, pain managed with PO meds, tolerating regular diet, and voiding without difficulty.   No fever/chills, chest pain, shortness of breath, nausea/vomiting, or leg pain. No nipple or breast pain. No headache, visual changes, or RUQ/epigastric pain.  Objective: BP 118/77 (BP Location: Left Arm)   Pulse 86   Temp 98.5 F (36.9 C) (Oral)   Resp 20   Ht 5\' 10"  (1.778 m)   Wt (!) 170.6 kg   LMP 09/12/2019   SpO2 96%   Breastfeeding Unknown   BMI 53.95 kg/m    Physical Exam:  General: alert and cooperative Breasts: soft/nontender CV: RRR Pulm: nl effort Abdomen: soft, non-tender Uterine Fundus: firm Incision: n/a Perineum: minimal edema, repair well approximated Lochia: appropriate DVT Evaluation: No evidence of DVT seen on physical exam. Edinburgh:  Edinburgh Postnatal Depression Scale Screening Tool 06/15/2020  I have been able to laugh and see the funny side of things. 0  I have looked forward with enjoyment to things. 0  I have blamed myself unnecessarily when things went wrong. 1  I have been anxious or worried for no good reason. 2  I have felt scared or panicky for no good reason. 1  Things have been getting on top of me. 1  I have been so unhappy that I have had difficulty sleeping. 0  I have felt sad or miserable. 0  I have been so unhappy that I have been crying. 0  The thought of harming myself has occurred to me. 0  Edinburgh Postnatal Depression Scale Total 5     Recent Labs    06/14/20 0026 06/15/20 0324  HGB 12.2 11.4*  HCT 36.8 33.1*  WBC 11.6* 19.0*  PLT 259 206    Assessment/Plan: 34 y.o. G1P1001 postpartum day # 1  -Continue routine postpartum care -Lactation consult PRN for breastfeeding   -Acute blood loss anemia - hemodynamically stable and asymptomatic; start PO ferrous sulfate BID with stool softeners  -Immunization status: all immunizations up to  date  Disposition: Continue inpatient postpartum care    LOS: 1 day   Tabita Corbo, CNM 06/15/2020, 8:04 AM

## 2020-06-15 NOTE — Lactation Note (Signed)
This note was copied from a baby's chart. Lactation Consultation Note  Patient Name: Rachel Shaw [id number]'[id number] Date: 06/15/2020 Reason for consult: Initial assessment;1st time breastfeeding;Early term 37-38.6wks Age:34 hours   Lactation visit for first time mom who delivered at [redacted]w[redacted]d 14hrs ago. Baby initially was being tracked for low sugar levels, and was given the direction to provide supplement post breastfeeding. Blood sugar concerns has resolved. Baby was sleeping soundly in bassinet, but mom wanted to attempt a feeding. [facility] student assisted with comfortable position for mom and support pillows. LC brought baby to mom in position of modified cradle hold. Baby did open mouth wide and latch easily, but only held the nipple in mouth, no eager to feed. Multiple attempts made to stimulate the baby with no success. LC encouraged putting baby skin to skin on chest, and reviewed early feeding cues and optimal time at getting baby to the breast. [facility] student provided guidance for newborn feeding patterns and behaviors, early cues, output expectations, positioning and support with pillows, and hand expression. Mom had no additional questions or needs at this time. LC encouraged 8 or more attempts in this first 24hrs but provided reassurance that baby may not feed each time and that would be an opportunity for hand expression. Encouraged to call out for ongoing BF assistance.  Maternal Data Formula Feeding for Exclusion: No Has patient been taught Hand Expression?: Yes Does the patient have breastfeeding experience prior to this delivery?: No  Feeding Feeding Type: Breast Fed  LATCH Score Latch: Repeated attempts needed to sustain latch, nipple held in mouth throughout feeding, stimulation needed to elicit sucking reflex.  Audible Swallowing: None  Type of Nipple: Everted at rest and after stimulation  Comfort (Breast/Nipple): Soft / non-tender  Hold (Positioning): Assistance needed to  correctly position infant at breast and maintain latch.  LATCH Score: 6  Interventions Interventions: Breast feeding basics reviewed;Assisted with latch;Skin to skin;Hand express;Support pillows;Position options  Lactation Tools Discussed/Used     Consult Status Consult Status: Follow-up Date: 06/15/20 Follow-up type: Call as needed    Danford Bad 06/15/2020, 11:55 AM

## 2020-06-16 ENCOUNTER — Inpatient Hospital Stay: Payer: Managed Care, Other (non HMO)

## 2020-06-16 LAB — COMPREHENSIVE METABOLIC PANEL WITH GFR
ALT: 12 U/L (ref 0–44)
AST: 14 U/L — ABNORMAL LOW (ref 15–41)
Albumin: 2.4 g/dL — ABNORMAL LOW (ref 3.5–5.0)
Alkaline Phosphatase: 114 U/L (ref 38–126)
Anion gap: 8 (ref 5–15)
BUN: 9 mg/dL (ref 6–20)
CO2: 19 mmol/L — ABNORMAL LOW (ref 22–32)
Calcium: 8.5 mg/dL — ABNORMAL LOW (ref 8.9–10.3)
Chloride: 110 mmol/L (ref 98–111)
Creatinine, Ser: 0.6 mg/dL (ref 0.44–1.00)
GFR, Estimated: >60 mL/min
Glucose, Bld: 82 mg/dL (ref 70–99)
Potassium: 3.7 mmol/L (ref 3.5–5.1)
Sodium: 137 mmol/L (ref 135–145)
Total Bilirubin: 0.4 mg/dL (ref 0.3–1.2)
Total Protein: 5.6 g/dL — ABNORMAL LOW (ref 6.5–8.1)

## 2020-06-16 LAB — CBC
HCT: 33.4 % — ABNORMAL LOW (ref 36.0–46.0)
Hemoglobin: 11.2 g/dL — ABNORMAL LOW (ref 12.0–15.0)
MCH: 27.1 pg (ref 26.0–34.0)
MCHC: 33.5 g/dL (ref 30.0–36.0)
MCV: 80.7 fL (ref 80.0–100.0)
Platelets: 223 10*3/uL (ref 150–400)
RBC: 4.14 MIL/uL (ref 3.87–5.11)
RDW: 14.7 % (ref 11.5–15.5)
WBC: 11.8 10*3/uL — ABNORMAL HIGH (ref 4.0–10.5)
nRBC: 0 % (ref 0.0–0.2)

## 2020-06-16 LAB — TROPONIN I (HIGH SENSITIVITY)
Troponin I (High Sensitivity): 12 ng/L (ref <18)
Troponin I (High Sensitivity): 14 ng/L

## 2020-06-16 LAB — BRAIN NATRIURETIC PEPTIDE: B Natriuretic Peptide: 110.1 pg/mL — ABNORMAL HIGH (ref 0.0–100.0)

## 2020-06-16 MED ORDER — NIFEDIPINE ER OSMOTIC RELEASE 30 MG PO TB24
30.0000 mg | ORAL_TABLET | Freq: Every day | ORAL | Status: DC
  Administered 2020-06-16: 20:00:00 30 mg via ORAL
  Filled 2020-06-16: qty 1

## 2020-06-16 MED ORDER — ENOXAPARIN SODIUM 40 MG/0.4ML ~~LOC~~ SOLN
40.0000 mg | Freq: Once | SUBCUTANEOUS | Status: AC
  Administered 2020-06-16: 09:00:00 40 mg via SUBCUTANEOUS
  Filled 2020-06-16: qty 0.4

## 2020-06-16 MED ORDER — FUROSEMIDE 10 MG/ML IJ SOLN
20.0000 mg | Freq: Four times a day (QID) | INTRAMUSCULAR | Status: AC
Start: 2020-06-16 — End: 2020-06-17
  Administered 2020-06-16 – 2020-06-17 (×4): 20 mg via INTRAVENOUS
  Filled 2020-06-16 (×4): qty 2

## 2020-06-16 MED ORDER — HYDROXYZINE HCL 25 MG PO TABS
25.0000 mg | ORAL_TABLET | Freq: Four times a day (QID) | ORAL | Status: DC | PRN
  Administered 2020-06-16: 07:00:00 25 mg via ORAL
  Filled 2020-06-16 (×2): qty 1

## 2020-06-16 NOTE — Progress Notes (Addendum)
Alerted by patients husband that pt was having new onset of swelling in her foot. Upon assessment pt sitting on side of the bed complaining of tightness in chest, with her heart rate "fluttering". Patient had bilateral 3+ pitting edema in lower extremities, an acute change from the beginning of the shift. Pt stated she felt short of breath while laying down in bed. BP elevated, 151/104. MD Dalbert Garnet paged and arrived at bedside. New orders placed for labs, chest xray, strict I&O's, IV lasix, STAT EKG, and TED hose.

## 2020-06-16 NOTE — Progress Notes (Signed)
Post Partum Day 2 Called to patient bedside for subjective sensation of chest pressure, "swimming" heart beat, new onset 3+pitting edema since last night and chest pressure that is localized to single spot over the left 2nd intercostal space. She has a hx of NSVD 36 hrs ago, PVCs, anxiety and idiopathic intracranial hypertension.  Left hand tightness, with bracelets tighter  Subjective: - No SOB with sitting, or deep breaths -+SOB when lying with feet elevated - +sensation of pressure on left chest when lying flat or with feet elevated -no dyspnea  - normal urination -+new 3+ pitting edema, pretibial, to below knee, bilaterally  Objective: Blood pressure (!) 142/89, pulse 73, temperature 98.6 F (37 C), temperature source Oral, resp. rate 20, height 5\' 10"  (1.778 m), weight (!) 170.6 kg, last menstrual period 09/12/2019, SpO2 99 %, +currently breastfeeding.   Body mass index is 53.95 kg/m.  Physical Exam:  General: alert, cooperative and appears stated age, +anxious, no acute distress Lochia: appropriate Pulm: CTAB in six lung fields, no increased work of breathing CV: regular rate and rhythm, no accessory sounds, very mild mid-systolic murmur,  Ext: bilateral 3+ pitting edema to below knee. Left hand without pitting edema but does appear tight when she makes a fist. Homan's negative, no calf tenderness  Recent Labs    06/14/20 0026 06/15/20 0324  HGB 12.2 11.4*  HCT 36.8 33.1*    Assessment/Plan:  Chest pain, new onset swelling PPD#2. DDx anxiety, peripartum cardiomyopathy, PreE. Patient appears well, anxious, pale, no acute distress. Less likely DVT to PE, sx anemia. Reassuringly, O2 sats 99%.  Plan: r/o cardiomyopathy, with low threshold for echo. Treat anxiety  - Stat EKG at the bedside now with normal sinus rhythm, normal rate - elevated BP in mild range, no severe range. No HA or visual changes.  - will get BNP, troponin I, CMP, CBC - if kidney function preserved,  will start mild diuresis with lasix 20mg  iv q6 hrs for 4 occurences. - strict Is and Os starting now - compression stockings now - lovenox ppx starting now, x1 dose  - O2 supplementation if needed to keep O2 sats >95%, but not needed currently - if severe range pressures, would start beta blocker. Will hold for now. If remains >135/85, will send home with Meds-to Beds program  Pt is breastfeeding. For anxiety acutely, will try vistaril. Consider SSRI. Hx of lexapro use for anxiety prior to pregnancy. Will restart iif needed  - uninterrupted sleep for 4 hours, starting now. Unless change in status, hold q 1 hr vitals until 10:00  -recommend f/u with provider in 1 week and 2 weeks to evaluate mental health   LOS: 2 days   06/17/20 06/16/2020, 6:36 AM

## 2020-06-16 NOTE — Progress Notes (Signed)
Post PartumDay 2  Subjective: up ad lib, voiding, tolerating PO and + flatus  Doing well, no concerns. Feeling significantly better after sleeping this morning. Ambulating without difficulty, pain managed with PO meds, tolerating regular diet, and voiding without difficulty.   No fever/chills, chest pain, shortness of breath, nausea/vomiting, or leg pain. No nipple or breast pain. No headache, visual changes, or RUQ/epigastric pain.  Objective: BP 134/89 (BP Location: Right Wrist)   Pulse 79   Temp 98.2 F (36.8 C) (Oral)   Resp 20   Ht 5\' 10"  (1.778 m)   Wt (!) 170.6 kg   LMP 09/12/2019   SpO2 100%   Breastfeeding Unknown   BMI 53.95 kg/m    Physical Exam:  General: alert, cooperative and no distress Breasts: soft/nontender CV: RRR Pulm: nl effort, CTABL Abdomen: soft, non-tender, active bowel sounds Extremities: 2+ pitting edema in lower extremities  Uterine Fundus: firm Perineum: minimal edema, repair well approximated Lochia: appropriate DVT Evaluation: No evidence of DVT seen on physical exam.  Recent Labs    06/15/20 0324 06/16/20 0937  HGB 11.4* 11.2*  HCT 33.1* 33.4*  WBC 19.0* 11.8*  PLT 206 223    Assessment/Plan: 34 y.o. G1P1001 postpartum day # 2  -Continue routine postpartum care -Lactation consult PRN for breastfeeding -Hemodynamically stable -Swelling has improved - urine output 06/18/20 since first dose of lasix  -Will continue with lasix for 24 hours  -B/P's WNL - will continue to monitor.  Will possibly need PO antihypertensive.  -Immunization status: all immunizations up to date  Disposition: Continue inpatient postpartum care   LOS: 2 days   , Gustavo Lah 06/16/2020, 12:42 PM   ----- 06/18/2020  Certified Nurse Midwife [facility]

## 2020-06-16 NOTE — Lactation Note (Signed)
This note was copied from a baby's chart. Lactation Consultation Note  Patient Name: Rachel Shaw [id number] Date: 06/16/2020 Reason for consult: Follow-up assessment;1st time breastfeeding;Early term 37-38.6wks;Other (Comment) (pump set-up) Age:34 hours  Lactation follow-up. Baby has continued to be supplemented with formula: 20-23mL at each feeding. Mom reports putting baby to breast after formula has been given because he has been to hungry to try before bottle. LC reviewed early feeding cues, skin to skin to calm baby, and other soothing techniques. Discussed the importance of adequate stimulation and milk removal to aid in onset of milk production. Reviewed milk supply and demand, and normal course of lactation. LC demonstrated and educated mom through set-up of DEBP, use, cleaning, and milk storage. Mom did pump without any output, but reassured that this was normal, and that consistency is important. LC also encouraged putting baby to breast with early cues before he becomes upset, letting him feed at the breast as long as he will, and then offering supplement post breastfeeding as another way to help promote onset of milk production. Lactation name/number available for family; encouraged to call out.  Maternal Data Formula Feeding for Exclusion: No Has patient been taught Hand Expression?: Yes Does the patient have breastfeeding experience prior to this delivery?: No  Feeding Feeding Type: Bottle Fed - Formula Nipple Type: Slow - flow  LATCH Score                   Interventions Interventions: Breast feeding basics reviewed;DEBP  Lactation Tools Discussed/Used Tools: Pump Breast pump type: Double-Electric Breast Pump Pump Education: Setup, frequency, and cleaning;Milk Storage Initiated by:: Magnus Ivan, MPH, IBCLC Date initiated:: 06/16/20   Consult Status Consult Status: Follow-up Date: 06/16/20 Follow-up type: Call as needed    Danford Bad 06/16/2020, 12:34 PM

## 2020-06-16 NOTE — Discharge Instructions (Signed)

## 2020-06-17 LAB — COMPREHENSIVE METABOLIC PANEL
ALT: 14 U/L (ref 0–44)
AST: 19 U/L (ref 15–41)
Albumin: 2.6 g/dL — ABNORMAL LOW (ref 3.5–5.0)
Alkaline Phosphatase: 116 U/L (ref 38–126)
Anion gap: 9 (ref 5–15)
BUN: 9 mg/dL (ref 6–20)
CO2: 25 mmol/L (ref 22–32)
Calcium: 8.4 mg/dL — ABNORMAL LOW (ref 8.9–10.3)
Chloride: 105 mmol/L (ref 98–111)
Creatinine, Ser: 0.69 mg/dL (ref 0.44–1.00)
GFR, Estimated: >60 mL/min (ref >60)
Glucose, Bld: 92 mg/dL (ref 70–99)
Potassium: 3.5 mmol/L (ref 3.5–5.1)
Sodium: 139 mmol/L (ref 135–145)
Total Bilirubin: 0.3 mg/dL (ref 0.3–1.2)
Total Protein: 6 g/dL — ABNORMAL LOW (ref 6.5–8.1)

## 2020-06-17 LAB — CBC WITH DIFFERENTIAL/PLATELET
Abs Immature Granulocytes: 0.02 10*3/uL (ref 0.00–0.07)
Basophils Absolute: 0 10*3/uL (ref 0.0–0.1)
Basophils Relative: 0 %
Eosinophils Absolute: 0.2 10*3/uL (ref 0.0–0.5)
Eosinophils Relative: 2 %
HCT: 33.5 % — ABNORMAL LOW (ref 36.0–46.0)
Hemoglobin: 11 g/dL — ABNORMAL LOW (ref 12.0–15.0)
Immature Granulocytes: 0 %
Lymphocytes Relative: 21 %
Lymphs Abs: 2.1 10*3/uL (ref 0.7–4.0)
MCH: 26.3 pg (ref 26.0–34.0)
MCHC: 32.8 g/dL (ref 30.0–36.0)
MCV: 80.1 fL (ref 80.0–100.0)
Monocytes Absolute: 0.5 10*3/uL (ref 0.1–1.0)
Monocytes Relative: 5 %
Neutro Abs: 7 10*3/uL (ref 1.7–7.7)
Neutrophils Relative %: 72 %
Platelets: 266 10*3/uL (ref 150–400)
RBC: 4.18 MIL/uL (ref 3.87–5.11)
RDW: 14.6 % (ref 11.5–15.5)
WBC: 9.7 10*3/uL (ref 4.0–10.5)
nRBC: 0 % (ref 0.0–0.2)

## 2020-06-17 LAB — TROPONIN I (HIGH SENSITIVITY): Troponin I (High Sensitivity): 9 ng/L (ref <18)

## 2020-06-17 LAB — BRAIN NATRIURETIC PEPTIDE: B Natriuretic Peptide: 87.7 pg/mL (ref 0.0–100.0)

## 2020-06-17 MED ORDER — NIFEDIPINE ER OSMOTIC RELEASE 30 MG PO TB24
60.0000 mg | ORAL_TABLET | Freq: Every day | ORAL | Status: DC
  Administered 2020-06-17: 12:00:00 60 mg via ORAL
  Filled 2020-06-17: qty 2

## 2020-06-17 MED ORDER — ESCITALOPRAM OXALATE 20 MG PO TABS: 20.0000 mg | ORAL_TABLET | Freq: Every day | ORAL | 3 refills | Status: AC

## 2020-06-17 MED ORDER — NIFEDIPINE ER OSMOTIC RELEASE 30 MG PO TB24: 60.0000 mg | ORAL_TABLET | Freq: Every day | ORAL | 3 refills | Status: DC

## 2020-06-17 NOTE — Progress Notes (Signed)
Discharge order received from doctor. Reviewed discharge instructions and prescriptions with patient and answered all questions. Follow up appointment instructions given. Patient verbalized understanding. ID bands checked. Patient discharged home with infant via wheelchair by nursing/auxillary.    Vara Mairena, RN  

## 2020-06-17 NOTE — Progress Notes (Signed)
Post Partum Day 3  Subjective: Doing well, no concerns. Ambulating without difficulty, pain managed with PO meds, tolerating regular diet, and voiding without difficulty.   No fever/chills, chest pain, shortness of breath, nausea/vomiting, or leg pain. No nipple or breast pain. No headache, visual changes, or RUQ/epigastric pain.  Objective: BP (!) 138/96 (BP Location: Right Arm) Comment: nurse Abby G. notified  Pulse 82   Temp 98.6 F (37 C) (Oral)   Resp 20   Ht 5\' 10"  (1.778 m)   Wt (!) 170.6 kg   LMP 09/12/2019   SpO2 100% Comment: Room Air  Breastfeeding Unknown   BMI 53.95 kg/m    Physical Exam:  General: alert and cooperative Breasts: soft/nontender CV: RRR Pulm: nl effort Abdomen: soft, non-tender Uterine Fundus: firm Incision: n/a Perineum: minimal edema, repair well approximated Lochia: appropriate DVT Evaluation: No evidence of DVT seen on physical exam. Edinburgh:  Edinburgh Postnatal Depression Scale Screening Tool 06/15/2020  I have been able to laugh and see the funny side of things. 0  I have looked forward with enjoyment to things. 0  I have blamed myself unnecessarily when things went wrong. 1  I have been anxious or worried for no good reason. 2  I have felt scared or panicky for no good reason. 1  Things have been getting on top of me. 1  I have been so unhappy that I have had difficulty sleeping. 0  I have felt sad or miserable. 0  I have been so unhappy that I have been crying. 0  The thought of harming myself has occurred to me. 0  Edinburgh Postnatal Depression Scale Total 5     Recent Labs    06/15/20 0324 06/16/20 0937  HGB 11.4* 11.2*  HCT 33.1* 33.4*  WBC 19.0* 11.8*  PLT 206 223    Assessment/Plan: 34 y.o. G1P1001 postpartum day # 3  -Continue routine postpartum care -Lactation consult PRN for breastfeeding    - BP and swelling 151/107, 149/102, 125/89, 135/98 Swelling improving TED hose on - dose of Lovenox 40 mg at  0926 Output last 12 hours: 06/18/20 = 583/hr 1932 Procardia 30mg  QD 0310 Lasix 20mg  QD  Disposition: Continue inpatient postpartum care    LOS: 3 days   Tyrin Herbers, CNM 06/17/2020, 8:04 AM

## 2020-06-17 NOTE — Progress Notes (Signed)
Pts BP elevated at 151/107. Pt repositioned in bed, instructed to lay down with legs uncrossed. BP repeated, 149/102. Pt voiced concern about her elevated BP and bilateral lower extremity pitting edema. Pt denies any chest discomfort, SOB. Pt stated she has gotten approx. 1 hour of sleep. This RN encouraged patient to lay down while baby boy is asleep and to call for assistance when needed.

## 2020-07-07 ENCOUNTER — Emergency Department: Payer: Managed Care, Other (non HMO)

## 2020-07-07 ENCOUNTER — Other Ambulatory Visit: Payer: Self-pay

## 2020-07-07 ENCOUNTER — Emergency Department
Admission: EM | Admit: 2020-07-07 | Discharge: 2020-07-07 | Disposition: A | Discharge status: Home / Self Care | Payer: Managed Care, Other (non HMO) | Attending: Emergency Medicine | Admitting: Emergency Medicine

## 2020-07-07 DIAGNOSIS — K219 Gastro-esophageal reflux disease without esophagitis: Secondary | ICD-10-CM | POA: Diagnosis not present

## 2020-07-07 DIAGNOSIS — Z79899 Other long term (current) drug therapy: Secondary | ICD-10-CM | POA: Insufficient documentation

## 2020-07-07 DIAGNOSIS — R109 Unspecified abdominal pain: Secondary | ICD-10-CM | POA: Diagnosis present

## 2020-07-07 DIAGNOSIS — I10 Essential (primary) hypertension: Secondary | ICD-10-CM | POA: Diagnosis not present

## 2020-07-07 DIAGNOSIS — K8020 Calculus of gallbladder without cholecystitis without obstruction: Secondary | ICD-10-CM | POA: Insufficient documentation

## 2020-07-07 LAB — COMPREHENSIVE METABOLIC PANEL
ALT: 27 U/L (ref 0–44)
AST: 22 U/L (ref 15–41)
Albumin: 3.9 g/dL (ref 3.5–5.0)
Alkaline Phosphatase: 130 U/L — ABNORMAL HIGH (ref 38–126)
Anion gap: 11 (ref 5–15)
BUN: 10 mg/dL (ref 6–20)
CO2: 26 mmol/L (ref 22–32)
Calcium: 8.9 mg/dL (ref 8.9–10.3)
Chloride: 103 mmol/L (ref 98–111)
Creatinine, Ser: 0.89 mg/dL (ref 0.44–1.00)
GFR, Estimated: >60 mL/min (ref >60)
Glucose, Bld: 143 mg/dL — ABNORMAL HIGH (ref 70–99)
Potassium: 3.8 mmol/L (ref 3.5–5.1)
Sodium: 140 mmol/L (ref 135–145)
Total Bilirubin: 0.4 mg/dL (ref 0.3–1.2)
Total Protein: 7.7 g/dL (ref 6.5–8.1)

## 2020-07-07 LAB — POC URINE PREG, ED: Preg Test, Ur: NEGATIVE

## 2020-07-07 LAB — URINE DRUG SCREEN, QUALITATIVE (ARMC ONLY)
Amphetamines, Ur Screen: NOT DETECTED
Barbiturates, Ur Screen: NOT DETECTED
Benzodiazepine, Ur Scrn: NOT DETECTED
Cannabinoid 50 Ng, Ur ~~LOC~~: NOT DETECTED
Cocaine Metabolite,Ur ~~LOC~~: NOT DETECTED
MDMA (Ecstasy)Ur Screen: NOT DETECTED
Methadone Scn, Ur: NOT DETECTED
Opiate, Ur Screen: NOT DETECTED
Phencyclidine (PCP) Ur S: NOT DETECTED
Tricyclic, Ur Screen: NOT DETECTED

## 2020-07-07 LAB — URINALYSIS, COMPLETE (UACMP) WITH MICROSCOPIC
Bilirubin Urine: NEGATIVE
Glucose, UA: NEGATIVE mg/dL
Ketones, ur: NEGATIVE mg/dL
Nitrite: NEGATIVE
Protein, ur: 100 mg/dL — AB
RBC / HPF: >50 RBC/hpf — ABNORMAL HIGH (ref 0–5)
Specific Gravity, Urine: 1.021 (ref 1.005–1.030)
WBC, UA: >50 WBC/hpf — ABNORMAL HIGH (ref 0–5)
pH: 5 (ref 5.0–8.0)

## 2020-07-07 LAB — TROPONIN I (HIGH SENSITIVITY): Troponin I (High Sensitivity): 3 ng/L (ref <18)

## 2020-07-07 LAB — CBC
HCT: 42.1 % (ref 36.0–46.0)
Hemoglobin: 13.3 g/dL (ref 12.0–15.0)
MCH: 25.9 pg — ABNORMAL LOW (ref 26.0–34.0)
MCHC: 31.6 g/dL (ref 30.0–36.0)
MCV: 82.1 fL (ref 80.0–100.0)
Platelets: 364 10*3/uL (ref 150–400)
RBC: 5.13 MIL/uL — ABNORMAL HIGH (ref 3.87–5.11)
RDW: 13.6 % (ref 11.5–15.5)
WBC: 13.9 10*3/uL — ABNORMAL HIGH (ref 4.0–10.5)
nRBC: 0 % (ref 0.0–0.2)

## 2020-07-07 LAB — LIPASE, BLOOD: Lipase: 31 U/L (ref 11–51)

## 2020-07-07 LAB — FIBRIN DERIVATIVES D-DIMER (ARMC ONLY): Fibrin derivatives D-dimer (ARMC): 449.91 ng/mL (FEU) (ref 0.00–499.00)

## 2020-07-07 MED ORDER — ONDANSETRON HCL 4 MG/2ML IJ SOLN
4.0000 mg | Freq: Once | INTRAMUSCULAR | Status: AC
  Administered 2020-07-07: 4 mg via INTRAVENOUS
  Filled 2020-07-07: qty 2

## 2020-07-07 MED ORDER — FENTANYL CITRATE (PF) 100 MCG/2ML IJ SOLN
50.0000 ug | Freq: Once | INTRAMUSCULAR | Status: AC
  Administered 2020-07-07: 50 ug via INTRAVENOUS
  Filled 2020-07-07: qty 2

## 2020-07-07 MED ORDER — KETOROLAC TROMETHAMINE 30 MG/ML IJ SOLN
15.0000 mg | Freq: Once | INTRAMUSCULAR | Status: AC
  Administered 2020-07-07: 15 mg via INTRAVENOUS
  Filled 2020-07-07: qty 1

## 2020-07-07 MED ORDER — IBUPROFEN 800 MG PO TABS: 800.0000 mg | ORAL_TABLET | Freq: Three times a day (TID) | ORAL | 0 refills | Status: DC | PRN

## 2020-07-07 MED ORDER — ONDANSETRON 4 MG PO TBDP: 4.0000 mg | ORAL_TABLET | Freq: Three times a day (TID) | ORAL | 0 refills | Status: DC | PRN

## 2020-07-07 NOTE — ED Notes (Signed)
E-signature not working at this time. Pt verbalized understanding of D/C instructions, prescriptions and follow up care with no further questions at this time. Pt in NAD and ambulatory at time of D/C.  

## 2020-07-07 NOTE — ED Triage Notes (Signed)
Pt in with co pain under right rib cage. States started yesterday before bed time, and pain has progressed. Pt denies any recent injury or hx of the same. Pt had vaginal delivery 3 weeks ago without complications.

## 2020-07-07 NOTE — ED Provider Notes (Signed)
[facility] Emergency Department Provider Note  ____________________________________________  Time seen: Approximately 5:05 AM  I have reviewed the triage vital signs and the nursing notes.   HISTORY  Chief Complaint Abdominal Pain   HPI Rachel Shaw is a 35 y.o. female with a history of postpartum HTN, PVC, GERD, anxiety, IIH, currently 3 weeks postpartum from NVD presents for evaluation of abdominal pain.  Patient reports that the pain started last night initially dull and then became sharp.  The pain is constant, sharp, and severe located under her right rib cage and radiating to the R flank.  The pain is worse with deep inspiration.  She has had severe nausea but no vomiting.  Denies any personal or family history of PE or DVT, no recent travel immobilization, no leg pain or swelling, no hemoptysis or exogenous hormones.   Past Medical History:  Diagnosis Date  . Anxiety   . GERD (gastroesophageal reflux disease)   . Hypertension   . PVC (premature ventricular contraction)     Patient Active Problem List   Diagnosis Date Noted  . NSVD (normal spontaneous vaginal delivery) 06/17/2020  . Preeclampsia, third trimester 06/14/2020  . 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 Vitamin B12 deficiency 03/14/2019  . Vitamin D deficiency 03/14/2019  . 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 Generalized anxiety disorder 07/12/2018  . Obesity 10/27/2013  . Hyperlipidemia 10/27/2013  . Migraine headache without aura 03/03/2013    Past Surgical History:  Procedure Laterality Date  . NO PAST SURGERIES      Prior to Admission medications   Medication Sig Start Date End Date Taking? Authorizing Provider  ibuprofen (ADVIL) 800 MG tablet Take 1 tablet (800 mg total) by mouth every 8 (eight) hours as needed. 07/07/20  Yes Don Perking, [place], MD  ondansetron (ZOFRAN ODT) 4 MG disintegrating tablet Take 1 tablet (4 mg total) by mouth  every 8 (eight) hours as needed. 07/07/20  Yes Danell Vazquez, [place], MD  escitalopram (LEXAPRO) 20 MG tablet Take 1 tablet (20 mg total) by mouth daily. 06/17/20 06/17/21  Haroldine Laws, CNM  NIFEdipine (PROCARDIA-XL/NIFEDICAL-XL) 30 MG 24 hr tablet Take 2 tablets (60 mg total) by mouth daily. 06/18/20   Haroldine Laws, CNM  omeprazole (PRILOSEC) 40 MG capsule Take 1 capsule (40 mg total) by mouth daily. 03/13/18 04/12/18  Nita Sickle, MD  Prenatal Vit-Fe Fumarate-FA (PRENATAL MULTIVITAMIN) TABS tablet Take 1 tablet by mouth daily at 12 noon.    [provider]    Allergies Azo [phenazopyridine]  Family History  Problem Relation Age of Onset  . Anxiety disorder Mother   . Diabetes Father   . Cancer Father     Social History Social History   Tobacco Use  . Smoking status: Never Smoker  . Smokeless tobacco: Never Used  Vaping Use  . Vaping Use: Never used  Substance Use Topics  . Alcohol use: Not Currently  . Drug use: Not Currently    Review of Systems  Constitutional: Negative for fever. Eyes: Negative for visual changes. ENT: Negative for sore throat. Neck: No neck pain  Cardiovascular: Negative for chest pain. Respiratory: Negative for shortness of breath. Gastrointestinal: + RUQ abdominal pain and nausea. No vomiting or diarrhea. Genitourinary: Negative for dysuria. Musculoskeletal: Negative for back pain. Skin: Negative for rash. Neurological: Negative for headaches, weakness or numbness. Psych: No SI or HI  ____________________________________________   PHYSICAL EXAM:  VITAL SIGNS: ED Triage Vitals  Enc Vitals Group     BP  07/07/20 0233 (!) 149/76     Pulse Rate 07/07/20 0233 (!) 106     Resp 07/07/20 0233 20     Temp 07/07/20 0233 98.1 F (36.7 C)     Temp Source 07/07/20 0233 Oral     SpO2 07/07/20 0233 99 %     Weight 07/07/20 0234 (!) 333 lb (151 kg)     Height 07/07/20 0234 5\' 10"  (1.778 m)     Head Circumference --      Peak  Flow --      Pain Score 07/07/20 0234 8     Pain Loc --      Pain Edu? --      Excl. in GC? --     Constitutional: Alert and oriented, looks very uncomfortable due to pain.  HEENT:      Head: Normocephalic and atraumatic.         Eyes: Conjunctivae are normal. Sclera is non-icteric.       Mouth/Throat: Mucous membranes are moist.       Neck: Supple with no signs of meningismus. Cardiovascular: Tachycardic with regular. Respiratory: Normal respiratory effort. Lungs are clear to auscultation bilaterally. No wheezes, crackles, or rhonchi.  Gastrointestinal: Obese, soft, tender to palpation over the RUQ, negative Murphy's sign, non distended with positive bowel sounds. No rebound or guarding. Genitourinary: No CVA tenderness. Musculoskeletal:  No edema, cyanosis, or erythema of extremities. Neurologic: Normal speech and language. Face is symmetric. Moving all extremities. No gross focal neurologic deficits are appreciated. Skin: Skin is warm, dry and intact. No rash noted. Psychiatric: Mood and affect are normal. Speech and behavior are normal.  ____________________________________________   LABS (all labs ordered are listed, but only abnormal results are displayed)  Labs Reviewed  CBC - Abnormal; Notable for the following components:      Result Value   WBC 13.9 (*)    RBC 5.13 (*)    MCH 25.9 (*)    All other components within normal limits  COMPREHENSIVE METABOLIC PANEL - Abnormal; Notable for the following components:   Glucose, Bld 143 (*)    Alkaline Phosphatase 130 (*)    All other components within normal limits  URINALYSIS, COMPLETE (UACMP) WITH MICROSCOPIC - Abnormal; Notable for the following components:   Color, Urine RED (*)    APPearance TURBID (*)    Hgb urine dipstick LARGE (*)    Protein, ur 100 (*)    Leukocytes,Ua MODERATE (*)    RBC / HPF >50 (*)    WBC, UA >50 (*)    Bacteria, UA MANY (*)    All other components within normal limits  URINE DRUG SCREEN,  QUALITATIVE ([facility] ONLY)  LIPASE, BLOOD  FIBRIN DERIVATIVES D-DIMER ([facility] ONLY)  POC URINE PREG, ED  TROPONIN I (HIGH SENSITIVITY)   ____________________________________________  EKG  none  ____________________________________________  RADIOLOGY  I have personally reviewed the images performed during this visit and I agree with the Radiologist's read.   Interpretation by Radiologist:  DG Chest 2 View  Result Date: 07/07/2020 CLINICAL DATA:  Initial evaluation for acute right-sided chest pain. EXAM: CHEST - 2 VIEW COMPARISON:  Prior radiograph from 06/16/2020. FINDINGS: The cardiac and mediastinal silhouettes are stable in size and contour, and remain within normal limits. The lungs are normally inflated. No airspace consolidation, pleural effusion, or pulmonary edema. No pneumothorax. No acute osseous abnormality. IMPRESSION: No active cardiopulmonary disease. Electronically Signed   By: Rise MuBenjamin  McClintock M.D.   On: 07/07/2020 03:46  CT Renal Stone Study  Result Date: 07/07/2020 CLINICAL DATA:  Pain under the right rib cage. EXAM: CT ABDOMEN AND PELVIS WITHOUT CONTRAST TECHNIQUE: Multidetector CT imaging of the abdomen and pelvis was performed following the standard protocol without IV contrast. COMPARISON:  03/12/2018 FINDINGS: Lower chest: The lung bases are clear. The heart size is normal. Hepatobiliary: The liver is normal. Normal gallbladder.There is no biliary ductal dilation. Pancreas: Normal contours without ductal dilatation. No peripancreatic fluid collection. Spleen: Unremarkable. Adrenals/Urinary Tract: --Adrenal glands: Unremarkable. --Right kidney/ureter: No hydronephrosis or radiopaque kidney stones. --Left kidney/ureter: No hydronephrosis or radiopaque kidney stones. --Urinary bladder: Unremarkable. Stomach/Bowel: --Stomach/Duodenum: No hiatal hernia or other gastric abnormality. Normal duodenal course and caliber. --Small bowel: Unremarkable. --Colon: There is apparent  circumferential wall thickening of the transverse colon. The colon is mostly underdistended which limits evaluation. --Appendix: Normal. Vascular/Lymphatic: Normal course and caliber of the major abdominal vessels. --No retroperitoneal lymphadenopathy. --No mesenteric lymphadenopathy. --No pelvic or inguinal lymphadenopathy. Reproductive: Unremarkable Other: No ascites or free air. The abdominal wall is normal. Musculoskeletal. No acute displaced fractures. IMPRESSION: 1. Apparent circumferential wall thickening of the transverse colon. This may be secondary to underdistention, but a mild colitis is not excluded. 2. Normal appendix. 3. No radiopaque kidney stones. No hydronephrosis. Electronically Signed   By: Katherine Mantle M.D.   On: 07/07/2020 06:02   US Abdomen Limited RUQ (LIVER/GB)  Result Date: 07/07/2020 CLINICAL DATA:  Initial evaluation for acute right upper quadrant pain. EXAM: ULTRASOUND ABDOMEN LIMITED RIGHT UPPER QUADRANT COMPARISON:  Prior CT from 03/12/2018. FINDINGS: Gallbladder: Shadowing echogenic stones seen within the gallbladder lumen, largest of which measures 12 mm. Gallbladder wall measures within normal limits at 2.5 mm. No free pericholecystic fluid. No sonographic Murphy sign elicited on exam. Common bile duct: Diameter: 3 mm Liver: No focal lesion identified. Within normal limits in parenchymal echogenicity. Portal vein is patent on color Doppler imaging with normal direction of blood flow towards the liver. Other: None. IMPRESSION: 1. Cholelithiasis. No sonographic features to suggest acute cholecystitis. 2. No biliary dilatation. Electronically Signed   By: Rise Mu M.D.   On: 07/07/2020 03:45     ____________________________________________   PROCEDURES  Procedure(s) performed:yes .1-3 Lead EKG Interpretation Performed by: Nita Sickle, MD Authorized by: Nita Sickle, MD     Interpretation: normal     ECG rate assessment: normal      Rhythm: sinus rhythm     Ectopy: none     Critical Care performed:  None ____________________________________________   INITIAL IMPRESSION / ASSESSMENT AND PLAN / ED COURSE   35 y.o. female with a history of postpartum HTN, PVC, GERD, anxiety, IIH, currently 3 weeks postpartum from NVD presents for evaluation of severe sharp pain located under her right rib cage and radiating to the right flank.  Patient looks uncomfortable due to pain, tachycardic but afebrile, abdomen is tender to palpation in the right upper quadrant with negative Murphy sign.  Ddx gallbladder pathology, kidney stone, PE, pancreatitis, PUD, pyelonephritis.  Right upper quadrant ultrasound visualized by me showing cholelithiasis but no evidence of cholecystitis, confirmed by radiology.  Chest x-ray also visualized by me with no acute findings, confirmed by radiology.  Labs show leukocytosis with white count of 13.9.  Normal LFTs and lipase. D-dimer is negative to rule out PE. UA pending but will definitely be unreliable considering the patient reports vaginal bleeding post delivery is still ongoing.  We will get a CT renal to rule out a kidney stone.  We  will treat her pain with IV Toradol, IV fentanyl, and IV Zofran.  If CT is negative for kidney stone will consult surgery for symptomatic cholelithiasis/early cholecystitis.  Old medical records reviewed.  History gathered from patient and her husband, plan discussed with both of them.  _________________________ 6:24 AM on 07/07/2020 -----------------------------------------  CT renal visualized by me with no signs of kidney stone, confirmed by radiology.  Patient's pain is fully resolved at this time and she no longer has any tenderness to palpation.  Presentation most likely concern for symptomatic cholelithiasis.  With normal labs and pain that has now fully resolved the patient will be referred for outpatient surgical evaluation.  We discussed dietary changes and avoidance  of fatty foods to prevent another attack.  Discussed my standard return precautions with both her and her husband.  Patient is now stable for discharge    _____________________________________________ Please note:  Patient was evaluated in Emergency Department today for the symptoms described in the history of present illness. Patient was evaluated in the context of the global COVID-19 pandemic, which necessitated consideration that the patient might be at risk for infection with the SARS-CoV-2 virus that causes COVID-19. Institutional protocols and algorithms that pertain to the evaluation of patients at risk for COVID-19 are in a state of rapid change based on information released by regulatory bodies including the CDC and federal and state organizations. These policies and algorithms were followed during the patient's care in the ED.  Some ED evaluations and interventions may be delayed as a result of limited staffing during the pandemic.   [place] Controlled Substance Database was reviewed by me. ____________________________________________   FINAL CLINICAL IMPRESSION(S) / ED DIAGNOSES   Final diagnoses:  Abdominal pain  Calculus of gallbladder without cholecystitis without obstruction      NEW MEDICATIONS STARTED DURING THIS VISIT:  ED Discharge Orders         Ordered    ibuprofen (ADVIL) 800 MG tablet  Every 8 hours PRN        07/07/20 0623    ondansetron (ZOFRAN ODT) 4 MG disintegrating tablet  Every 8 hours PRN        07/07/20 3009           Note:  This document was prepared using Dragon voice recognition software and may include unintentional dictation errors.    Nita Sickle, MD 07/07/20 [phone]

## 2020-08-05 ENCOUNTER — Other Ambulatory Visit (HOSPITAL_COMMUNITY): Payer: Self-pay | Admitting: Neurology

## 2020-08-05 ENCOUNTER — Other Ambulatory Visit: Payer: Self-pay | Admitting: Neurology

## 2020-08-05 DIAGNOSIS — G43019 Migraine without aura, intractable, without status migrainosus: Secondary | ICD-10-CM

## 2020-08-17 ENCOUNTER — Ambulatory Visit: Admission: RE | Admit: 2020-08-17 | Payer: Managed Care, Other (non HMO) | Source: Ambulatory Visit

## 2020-08-18 ENCOUNTER — Other Ambulatory Visit: Payer: Self-pay

## 2020-08-23 ENCOUNTER — Other Ambulatory Visit: Payer: Self-pay

## 2020-08-23 DIAGNOSIS — N6312 Unspecified lump in the right breast, upper inner quadrant: Secondary | ICD-10-CM

## 2020-08-25 ENCOUNTER — Ambulatory Visit
Admission: RE | Admit: 2020-08-25 | Discharge: 2020-08-25 | Disposition: A | Discharge status: Home / Self Care | Payer: Managed Care, Other (non HMO) | Source: Ambulatory Visit | Attending: Neurology | Admitting: Neurology

## 2020-08-25 ENCOUNTER — Other Ambulatory Visit: Payer: Self-pay

## 2020-08-25 ENCOUNTER — Other Ambulatory Visit: Payer: Managed Care, Other (non HMO)

## 2020-08-25 DIAGNOSIS — G43019 Migraine without aura, intractable, without status migrainosus: Secondary | ICD-10-CM | POA: Diagnosis not present

## 2020-08-25 MED ORDER — IOHEXOL 350 MG/ML SOLN
100.0000 mL | Freq: Once | INTRAVENOUS | Status: AC | PRN
  Administered 2020-08-25: 100 mL via INTRAVENOUS

## 2020-09-02 ENCOUNTER — Ambulatory Visit
Admission: RE | Admit: 2020-09-02 | Discharge: 2020-09-02 | Disposition: A | Discharge status: Home / Self Care | Payer: Managed Care, Other (non HMO) | Source: Ambulatory Visit

## 2020-09-02 ENCOUNTER — Other Ambulatory Visit: Payer: Self-pay

## 2020-09-02 DIAGNOSIS — N6312 Unspecified lump in the right breast, upper inner quadrant: Secondary | ICD-10-CM | POA: Diagnosis not present

## 2020-10-04 ENCOUNTER — Emergency Department: Payer: Managed Care, Other (non HMO)

## 2020-10-04 ENCOUNTER — Other Ambulatory Visit: Payer: Self-pay

## 2020-10-04 ENCOUNTER — Emergency Department
Admission: EM | Admit: 2020-10-04 | Discharge: 2020-10-04 | Disposition: A | Discharge status: Home / Self Care | Payer: Managed Care, Other (non HMO) | Attending: Emergency Medicine | Admitting: Emergency Medicine

## 2020-10-04 DIAGNOSIS — I10 Essential (primary) hypertension: Secondary | ICD-10-CM | POA: Insufficient documentation

## 2020-10-04 DIAGNOSIS — R0789 Other chest pain: Secondary | ICD-10-CM | POA: Insufficient documentation

## 2020-10-04 DIAGNOSIS — R221 Localized swelling, mass and lump, neck: Secondary | ICD-10-CM | POA: Diagnosis not present

## 2020-10-04 DIAGNOSIS — R079 Chest pain, unspecified: Secondary | ICD-10-CM | POA: Diagnosis present

## 2020-10-04 LAB — BASIC METABOLIC PANEL
Anion gap: 10 (ref 5–15)
BUN: 10 mg/dL (ref 6–20)
CO2: 21 mmol/L — ABNORMAL LOW (ref 22–32)
Calcium: 8.8 mg/dL — ABNORMAL LOW (ref 8.9–10.3)
Chloride: 103 mmol/L (ref 98–111)
Creatinine, Ser: 0.83 mg/dL (ref 0.44–1.00)
GFR, Estimated: >60 mL/min (ref >60)
Glucose, Bld: 133 mg/dL — ABNORMAL HIGH (ref 70–99)
Potassium: 3.6 mmol/L (ref 3.5–5.1)
Sodium: 134 mmol/L — ABNORMAL LOW (ref 135–145)

## 2020-10-04 LAB — CBC
HCT: 40.6 % (ref 36.0–46.0)
Hemoglobin: 12.9 g/dL (ref 12.0–15.0)
MCH: 24.1 pg — ABNORMAL LOW (ref 26.0–34.0)
MCHC: 31.8 g/dL (ref 30.0–36.0)
MCV: 75.9 fL — ABNORMAL LOW (ref 80.0–100.0)
Platelets: 342 10*3/uL (ref 150–400)
RBC: 5.35 MIL/uL — ABNORMAL HIGH (ref 3.87–5.11)
RDW: 13.3 % (ref 11.5–15.5)
WBC: 9.3 10*3/uL (ref 4.0–10.5)
nRBC: 0 % (ref 0.0–0.2)

## 2020-10-04 LAB — TROPONIN I (HIGH SENSITIVITY): Troponin I (High Sensitivity): 3 ng/L (ref <18)

## 2020-10-04 NOTE — ED Triage Notes (Signed)
Pt c/o "feels like something is tuck right here in my chest" for the past 3-4 days, states she has a hx of anxiety and could be just that but is concerned it is cardiac related. Pt is in NAD at present. Respirations WNL

## 2020-10-04 NOTE — ED Provider Notes (Signed)
[facility] Emergency Department Provider Note   ____________________________________________    I have reviewed the triage vital signs and the nursing notes.   HISTORY  Chief Complaint Chest Pain     HPI Rachel Shaw is a 35 y.o. female with a history of anxiety, GERD who presents with complaints of 3 to 4 days of feeling a "lump in her throat ", and the top of her chest.  She thinks this is related to anxiety but she is concerned that it could be related to her heart and wants to be evaluated.  She denies shortness of breath, no pleurisy, no calf pain, no lower extremity swelling.  No history of heart disease.  Does not smoke.  No drug use.  Has not take anything for this.  Past Medical History:  Diagnosis Date  . Anxiety   . GERD (gastroesophageal reflux disease)   . Hypertension   . PVC (premature ventricular contraction)     Patient Active Problem List   Diagnosis Date Noted  . NSVD (normal spontaneous vaginal delivery) 06/17/2020  . Preeclampsia, third trimester 06/14/2020  . 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 Vitamin B12 deficiency 03/14/2019  . Vitamin D deficiency 03/14/2019  . 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 Generalized anxiety disorder 07/12/2018  . Obesity 10/27/2013  . Hyperlipidemia 10/27/2013  . Migraine headache without aura 03/03/2013    Past Surgical History:  Procedure Laterality Date  . NO PAST SURGERIES      Prior to Admission medications   Medication Sig Start Date End Date Taking? Authorizing Provider  escitalopram (LEXAPRO) 20 MG tablet Take 1 tablet (20 mg total) by mouth daily. 06/17/20 06/17/21  Haroldine Laws, CNM  ibuprofen (ADVIL) 800 MG tablet Take 1 tablet (800 mg total) by mouth every 8 (eight) hours as needed. 07/07/20   Nita Sickle, MD  NIFEdipine (PROCARDIA-XL/NIFEDICAL-XL) 30 MG 24 hr tablet Take 2 tablets (60 mg total) by mouth daily. 06/18/20    Haroldine Laws, CNM  omeprazole (PRILOSEC) 40 MG capsule Take 1 capsule (40 mg total) by mouth daily. 03/13/18 04/12/18  Nita Sickle, MD  ondansetron (ZOFRAN ODT) 4 MG disintegrating tablet Take 1 tablet (4 mg total) by mouth every 8 (eight) hours as needed. 07/07/20   Nita Sickle, MD  Prenatal Vit-Fe Fumarate-FA (PRENATAL MULTIVITAMIN) TABS tablet Take 1 tablet by mouth daily at 12 noon.    [provider]     Allergies Azo [phenazopyridine]  Family History  Problem Relation Age of Onset  . Anxiety disorder Mother   . Diabetes Father   . Cancer Father   . Breast cancer Other     Social History Social History   Tobacco Use  . Smoking status: Never Smoker  . Smokeless tobacco: Never Used  Vaping Use  . Vaping Use: Never used  Substance Use Topics  . Alcohol use: Not Currently  . Drug use: Not Currently    Review of Systems  Constitutional: No fever/chills Eyes: No visual changes.  ENT: No difficulty swallowing Cardiovascular: As above Respiratory: As above Gastrointestinal: No abdominal pain.  No nausea, no vomiting.   Genitourinary: Negative for dysuria. Musculoskeletal: Negative for leg pain Skin: Negative for rash. Neurological: Negative for headaches    ____________________________________________   PHYSICAL EXAM:  VITAL SIGNS: ED Triage Vitals  Enc Vitals Group     BP 10/04/20 0831 106/81     Pulse Rate 10/04/20 0831 (!) 18     Resp 10/04/20 0831 18  Temp 10/04/20 0831 98.5 F (36.9 C)     Temp Source 10/04/20 0831 Oral     SpO2 10/04/20 0831 100 %     Weight 10/04/20 0829 (!) 151 kg (333 lb)     Height 10/04/20 0829 1.778 m (5\' 10" )     Head Circumference --      Peak Flow --      Pain Score 10/04/20 0829 4     Pain Loc --      Pain Edu? --      Excl. in [place]? --     Constitutional: Alert and oriented.   Nose: No congestion/rhinnorhea. Mouth/Throat: Mucous membranes are moist.  Pharynx normal Neck:  Painless  ROM Cardiovascular: Normal rate, regular rhythm. Grossly normal heart sounds.  Good peripheral circulation. Respiratory: Normal respiratory effort.  No retractions. Lungs CTAB.   Musculoskeletal: No lower extremity tenderness nor edema.  Warm and well perfused Neurologic:  Normal speech and language. No gross focal neurologic deficits are appreciated.  Skin:  Skin is warm, dry and intact. No rash noted. Psychiatric: Mood and affect are normal. Speech and behavior are normal.  ____________________________________________   LABS (all labs ordered are listed, but only abnormal results are displayed)  Labs Reviewed  BASIC METABOLIC PANEL - Abnormal; Notable for the following components:      Result Value   Sodium 134 (*)    CO2 21 (*)    Glucose, Bld 133 (*)    Calcium 8.8 (*)    All other components within normal limits  CBC - Abnormal; Notable for the following components:   RBC 5.35 (*)    MCV 75.9 (*)    MCH 24.1 (*)    All other components within normal limits  POC URINE PREG, ED  TROPONIN I (HIGH SENSITIVITY)   ____________________________________________  EKG  ED ECG REPORT I, 10/06/20, the attending physician, personally viewed and interpreted this ECG.  Date: 10/04/2020  Rhythm: normal sinus rhythm QRS Axis: normal Intervals: normal ST/T Wave abnormalities: Nonspecific changes Narrative Interpretation: no evidence of acute ischemia  ____________________________________________  RADIOLOGY  Chest x-ray reviewed by me ____________________________________________   PROCEDURES  Procedure(s) performed: No  Procedures   Critical Care performed: No ____________________________________________   INITIAL IMPRESSION / ASSESSMENT AND PLAN / ED COURSE  Pertinent labs & imaging results that were available during my care of the patient were reviewed by me and considered in my medical decision making (see chart for details).  Patient presents with  feeling of lump in the throat/top of the chest x3 to 4 days.  EKG is reassuring, low risk for ACS, she feels this is likely anxiety related and she has had this before but not for several days in a row apparently.  No shortness of breath or pleurisy.  No allergic symptoms.  Reassuring exam, reassuring EKG, pending chest x-ray, high sensitive troponin.  Lab work is unremarkable, chest x-ray is normal, appropriate discharge at this time with outpatient follow-up as needed.    ____________________________________________   FINAL CLINICAL IMPRESSION(S) / ED DIAGNOSES  Final diagnoses:  Atypical chest pain        Note:  This document was prepared using Dragon voice recognition software and may include unintentional dictation errors.   10/06/2020, MD 10/04/20 [phone]

## 2020-10-04 NOTE — ED Notes (Signed)
EDP at bedside  

## 2020-10-04 NOTE — ED Notes (Signed)
Pt alert and oriented X 4, stable for discharge. RR even and unlabored, color WNL. Discussed discharge instructions and follow-up as directed. Discharge medications discussed if prescribed. Pt had opportunity to ask questions, and RN to provide patient/family eduction.  

## 2022-03-29 ENCOUNTER — Other Ambulatory Visit: Payer: Self-pay

## 2022-03-29 ENCOUNTER — Emergency Department
Admission: EM | Admit: 2022-03-29 | Discharge: 2022-03-29 | Disposition: A | Discharge status: Home / Self Care | Payer: Managed Care, Other (non HMO) | Attending: Emergency Medicine | Admitting: Emergency Medicine

## 2022-03-29 ENCOUNTER — Emergency Department: Payer: Managed Care, Other (non HMO)

## 2022-03-29 DIAGNOSIS — K8051 Calculus of bile duct without cholangitis or cholecystitis with obstruction: Secondary | ICD-10-CM | POA: Insufficient documentation

## 2022-03-29 DIAGNOSIS — K759 Inflammatory liver disease, unspecified: Secondary | ICD-10-CM | POA: Diagnosis not present

## 2022-03-29 DIAGNOSIS — K8050 Calculus of bile duct without cholangitis or cholecystitis without obstruction: Secondary | ICD-10-CM

## 2022-03-29 DIAGNOSIS — I10 Essential (primary) hypertension: Secondary | ICD-10-CM | POA: Diagnosis not present

## 2022-03-29 DIAGNOSIS — R1011 Right upper quadrant pain: Secondary | ICD-10-CM | POA: Diagnosis present

## 2022-03-29 LAB — CBC WITH DIFFERENTIAL/PLATELET
Abs Immature Granulocytes: 0.08 10*3/uL — ABNORMAL HIGH (ref 0.00–0.07)
Basophils Absolute: 0 10*3/uL (ref 0.0–0.1)
Basophils Relative: 0 %
Eosinophils Absolute: 0 10*3/uL (ref 0.0–0.5)
Eosinophils Relative: 0 %
HCT: 43.3 % (ref 36.0–46.0)
Hemoglobin: 13.3 g/dL (ref 12.0–15.0)
Immature Granulocytes: 1 %
Lymphocytes Relative: 16 %
Lymphs Abs: 2.4 10*3/uL (ref 0.7–4.0)
MCH: 23.6 pg — ABNORMAL LOW (ref 26.0–34.0)
MCHC: 30.7 g/dL (ref 30.0–36.0)
MCV: 76.9 fL — ABNORMAL LOW (ref 80.0–100.0)
Monocytes Absolute: 0.8 10*3/uL (ref 0.1–1.0)
Monocytes Relative: 5 %
Neutro Abs: 12.2 10*3/uL — ABNORMAL HIGH (ref 1.7–7.7)
Neutrophils Relative %: 78 %
Platelets: 380 10*3/uL (ref 150–400)
RBC: 5.63 MIL/uL — ABNORMAL HIGH (ref 3.87–5.11)
RDW: 15.1 % (ref 11.5–15.5)
WBC: 15.5 10*3/uL — ABNORMAL HIGH (ref 4.0–10.5)
nRBC: 0 % (ref 0.0–0.2)

## 2022-03-29 LAB — COMPREHENSIVE METABOLIC PANEL
ALT: 118 U/L — ABNORMAL HIGH (ref 0–44)
AST: 206 U/L — ABNORMAL HIGH (ref 15–41)
Albumin: 4.1 g/dL (ref 3.5–5.0)
Alkaline Phosphatase: 169 U/L — ABNORMAL HIGH (ref 38–126)
Anion gap: 10 (ref 5–15)
BUN: 12 mg/dL (ref 6–20)
CO2: 24 mmol/L (ref 22–32)
Calcium: 9.4 mg/dL (ref 8.9–10.3)
Chloride: 104 mmol/L (ref 98–111)
Creatinine, Ser: 0.72 mg/dL (ref 0.44–1.00)
GFR, Estimated: >60 mL/min (ref >60)
Glucose, Bld: 108 mg/dL — ABNORMAL HIGH (ref 70–99)
Potassium: 3.6 mmol/L (ref 3.5–5.1)
Sodium: 138 mmol/L (ref 135–145)
Total Bilirubin: 0.7 mg/dL (ref 0.3–1.2)
Total Protein: 8 g/dL (ref 6.5–8.1)

## 2022-03-29 LAB — URINALYSIS, ROUTINE W REFLEX MICROSCOPIC
Bacteria, UA: NONE SEEN
Bilirubin Urine: NEGATIVE
Glucose, UA: NEGATIVE mg/dL
Hgb urine dipstick: NEGATIVE
Ketones, ur: NEGATIVE mg/dL
Nitrite: NEGATIVE
Protein, ur: NEGATIVE mg/dL
Specific Gravity, Urine: 1.004 — ABNORMAL LOW (ref 1.005–1.030)
pH: 6 (ref 5.0–8.0)

## 2022-03-29 LAB — POC URINE PREG, ED: Preg Test, Ur: NEGATIVE

## 2022-03-29 LAB — LIPASE, BLOOD: Lipase: 36 U/L (ref 11–51)

## 2022-03-29 MED ORDER — GADOBUTROL 1 MMOL/ML IV SOLN
10.0000 mL | Freq: Once | INTRAVENOUS | Status: AC | PRN
  Administered 2022-03-29: 10 mL via INTRAVENOUS

## 2022-03-29 MED ORDER — OXYCODONE-ACETAMINOPHEN 5-325 MG PO TABS
1.0000 | ORAL_TABLET | Freq: Once | ORAL | Status: AC
  Administered 2022-03-29: 1 via ORAL
  Filled 2022-03-29: qty 1

## 2022-03-29 MED ORDER — ONDANSETRON 4 MG PO TBDP
4.0000 mg | ORAL_TABLET | Freq: Once | ORAL | Status: AC
  Administered 2022-03-29: 4 mg via ORAL
  Filled 2022-03-29: qty 1

## 2022-03-29 NOTE — ED Triage Notes (Signed)
Pt arrives with c/o RUQ ABD pain that started today. Pt endorses nausea. Pt has hx of gallstones and pt states the pain is the same.

## 2022-03-29 NOTE — ED Provider Notes (Signed)
[facility] Provider Note    None    (approximate)   History   Chief Complaint Abdominal Pain   HPI  Rachel Shaw is a 36 y.o. female with past medical history of hypertension, migraines, and anxiety who presents to the ED complaining of abdominal pain.  Patient reports that she has been dealing with constant pain in the right upper quadrant of her abdomen since waking up this morning.  She describes the pain as sharp and not exacerbated or alleviated by anything in particular.  She has been nauseous but has not vomited, denies any fevers, dysuria, or flank pain.  She describes similar symptoms with gallstones in the past, which she was initially diagnosed with while postpartum last year.      Physical Exam   Triage Vital Signs: ED Triage Vitals  Enc Vitals Group     BP 03/29/22 1658 127/83     Pulse Rate 03/29/22 1658 72     Resp 03/29/22 1658 16     Temp 03/29/22 1658 98.4 F (36.9 C)     Temp Source 03/29/22 1658 Oral     SpO2 03/29/22 1658 100 %     Weight 03/29/22 1658 (!) 376 lb (170.6 kg)     Height 03/29/22 1658 5\' 10"  (1.778 m)     Head Circumference --      Peak Flow --      Pain Score 03/29/22 1712 8     Pain Loc --      Pain Edu? --      Excl. in [place]? --     Most recent vital signs: Vitals:   03/29/22 1658 03/29/22 2232  BP: 127/83 139/86  Pulse: 72 70  Resp: 16 20  Temp: 98.4 F (36.9 C) 98.8 F (37.1 C)  SpO2: 100% 99%    Constitutional: Alert and oriented. Eyes: Conjunctivae are normal. Head: Atraumatic. Nose: No congestion/rhinnorhea. Mouth/Throat: Mucous membranes are moist.  Cardiovascular: Normal rate, regular rhythm. Grossly normal heart sounds.  2+ radial pulses bilaterally. Respiratory: Normal respiratory effort.  No retractions. Lungs CTAB. Gastrointestinal: Soft and tender to palpation in the right upper quadrant with no rebound or guarding. No distention. Musculoskeletal: No lower extremity tenderness  nor edema.  Neurologic:  Normal speech and language. No gross focal neurologic deficits are appreciated.    ED Results / Procedures / Treatments   Labs (all labs ordered are listed, but only abnormal results are displayed) Labs Reviewed  CBC WITH DIFFERENTIAL/PLATELET - Abnormal; Notable for the following components:      Result Value   WBC 15.5 (*)    RBC 5.63 (*)    MCV 76.9 (*)    MCH 23.6 (*)    Neutro Abs 12.2 (*)    Abs Immature Granulocytes 0.08 (*)    All other components within normal limits  COMPREHENSIVE METABOLIC PANEL - Abnormal; Notable for the following components:   Glucose, Bld 108 (*)    AST 206 (*)    ALT 118 (*)    Alkaline Phosphatase 169 (*)    All other components within normal limits  URINALYSIS, ROUTINE W REFLEX MICROSCOPIC - Abnormal; Notable for the following components:   Color, Urine YELLOW (*)    APPearance HAZY (*)    Specific Gravity, Urine 1.004 (*)    Leukocytes,Ua TRACE (*)    All other components within normal limits  LIPASE, BLOOD  POC URINE PREG, ED    RADIOLOGY Right upper quadrant ultrasound reviewed  and interpreted by me with cholelithiasis but no signs of cholecystitis, patient does have enlarged CBD.  PROCEDURES:  Critical Care performed: No  Procedures   MEDICATIONS ORDERED IN ED: Medications  ondansetron (ZOFRAN-ODT) disintegrating tablet 4 mg (4 mg Oral Given 03/29/22 1755)  oxyCODONE-acetaminophen (PERCOCET/ROXICET) 5-325 MG per tablet 1 tablet (1 tablet Oral Given 03/29/22 1754)  gadobutrol (GADAVIST) 1 MMOL/ML injection 10 mL (10 mLs Intravenous Contrast Given 03/29/22 2228)     IMPRESSION / MDM / ASSESSMENT AND PLAN / ED COURSE  I reviewed the triage vital signs and the nursing notes.                              36 y.o. female with past medical history of hypertension, migraines, and anxiety who presents to the ED complaining of constant right upper quadrant abdominal pain since earlier this morning  associated with nausea.  Patient's presentation is most consistent with acute presentation with potential threat to life or bodily function.  Differential diagnosis includes, but is not limited to, biliary colic, cholecystitis, choledocholithiasis, hepatitis, pancreatitis, electrolyte abnormality, AKI.  Patient nontoxic-appearing and in no acute distress, vital signs are unremarkable.  She does have tenderness to palpation in the right upper quadrant of her abdomen, ultrasound reveals cholelithiasis with no signs of cholecystitis, however patient has significantly enlarged CBD.  LFTs are abnormal with transaminitis and mild elevation in alkaline phosphatase, however bilirubin is within normal limits.  Patient denies significant Tylenol use, also denies alcohol abuse.  We will further assess with MRCP for potential choledocholithiasis.  Additional labs remarkable for mild leukocytosis but low suspicion for associated cholangitis and we will hold off on antibiotics for now.  Pregnancy testing is negative and urinalysis shows no signs of infection, patient received Percocet with improvement in pain.  MRCP shows cholelithiasis with no evidence of cholecystitis or choledocholithiasis.  CBD measures at 4 mm on MRCP and no indication for ERCP at this time.  On reassessment, her pain is much improved and I suspect symptoms are due to biliary colic.  She would be appropriate for discharge home with outpatient general surgery follow-up as well as PCP follow-up for recheck of her liver enzymes.  Patient declines prescription for pain or nausea medication.  She was counseled to return to the ED for new or worsening symptoms, patient agrees with plan.      FINAL CLINICAL IMPRESSION(S) / ED DIAGNOSES   Final diagnoses:  Biliary colic  Hepatitis     Rx / DC Orders   ED Discharge Orders     None        Note:  This document was prepared using Dragon voice recognition software and may include  unintentional dictation errors.   Chesley Noon, MD 03/29/22 2257

## 2022-03-29 NOTE — ED Notes (Signed)
Patient returned to ED from MRI.  

## 2022-03-29 NOTE — ED Notes (Signed)
No answer in lobby when called for repeat vital signs 

## 2022-03-29 NOTE — ED Provider Triage Note (Signed)
  Emergency Medicine Provider Triage Evaluation Note  Rachel Shaw , a 36 y.o.female,  was evaluated in triage.  Pt complains of right upper quadrant abdominal pain.  Patient states that she has a history of gallstones and believes she is having another one.  Her pain started earlier today in the right upper quadrant with radiation into the back.  She has taken ibuprofen with minimal relief.  Current endorsing nausea.  No other symptoms at this time.   Review of Systems  Positive: Abdominal pain, nausea Negative: Denies fever, chest pain, vomiting  Physical Exam  There were no vitals filed for this visit. Gen:   Awake, no distress   Resp:  Normal effort  MSK:   Moves extremities without difficulty  Other:    Medical Decision Making  Given the patient's initial medical screening exam, the following diagnostic evaluation has been ordered. The patient will be placed in the appropriate treatment space, once one is available, to complete the evaluation and treatment. I have discussed the plan of care with the patient and I have advised the patient that an ED physician or mid-level practitioner will reevaluate their condition after the test results have been received, as the results may give them additional insight into the type of treatment they may need.    Diagnostics: Labs, EKG, UA, right upper quadrant ultrasound  Treatments: none immediately   Teodoro Spray, [place] 03/29/22 1605

## 2022-03-29 NOTE — ED Notes (Signed)
Patient in MRI at this time. 

## 2022-04-05 ENCOUNTER — Ambulatory Visit: Payer: Self-pay | Admitting: Surgery

## 2022-04-05 NOTE — H&P (Signed)
Subjective:   CC: Biliary colic [K80.50]  HPI:  Rachel Shaw is a 36 y.o. female who returns for evaluation of above CC. Had another episode prompting ED visit, pain resolved with no signs of acute cholecystitis.  Did have dilated CBD and elevated LFTs at that time.    Past Medical History:  has a past medical history of GERD (gastroesophageal reflux disease) and Seizures (CMS-HCC).  Past Surgical History:  has a past surgical history that includes Wisdom teeth and Cystectomy.  Family History: family history includes Migraines in her brother and mother.  Social History:  reports that she has never smoked. She has never used smokeless tobacco. She reports that she does not currently use alcohol. She reports that she does not currently use drugs.  Current Medications: has a current medication list which includes the following prescription(s): escitalopram oxalate, omeprazole, acetazolamide, escitalopram oxalate, famotidine, ibuprofen, norethindrone, and prenatal vitamin with iron-folic acid.  Allergies:  Allergies as of 04/05/2022 - Reviewed 04/05/2022  Allergen Reaction Noted   Phenazopyridine Hives 03/10/2013    ROS:  A 15 point review of systems was performed and pertinent positives and negatives noted in HPI    Objective:     BP 122/89 (BP Location: Left upper arm, Patient Position: Sitting)   Pulse 88   Ht 177.8 cm (5\' 10" ) Comment: per chart  Wt (!) 160.6 kg (354 lb) Comment: per chart  BMI 50.79 kg/m    Constitutional :  alert, appears stated age, cooperative and no distress  Lymphatics/Throat:  no asymmetry, masses, or scars  Respiratory:  clear to auscultation bilaterally  Cardiovascular:  regular rate and rhythm  Gastrointestinal: soft, non-tender; bowel sounds normal; no masses,  no organomegaly.    Musculoskeletal: Steady gait and movement  Skin: Cool and moist  Psychiatric: Normal affect, non-agitated, not confused       LABS:  n/a   RADS: CLINICAL  DATA:  Initial evaluation for acute right upper quadrant  pain.   EXAM:  ULTRASOUND ABDOMEN LIMITED RIGHT UPPER QUADRANT   COMPARISON:  Prior CT from 03/12/2018.   FINDINGS:  Gallbladder:   Shadowing echogenic stones seen within the gallbladder lumen,  largest of which measures 12 mm. Gallbladder wall measures within  normal limits at 2.5 mm. No free pericholecystic fluid. No  sonographic Murphy sign elicited on exam.   Common bile duct:   Diameter: 3 mm   Liver:   No focal lesion identified. Within normal limits in parenchymal  echogenicity. Portal vein is patent on color Doppler imaging with  normal direction of blood flow towards the liver.   Other: None.   IMPRESSION:  1. Cholelithiasis. No sonographic features to suggest acute  cholecystitis.  2. No biliary dilatation.    Electronically Signed    By: 03/14/2018 M.D.    On: 07/07/2020 03:45   Assessment:      Biliary colic [K80.50]  Obesity  Plan:     1. Biliary colic [K80.50] We again discussed pathophysiology of biliary colic and also potential complications.  She will like to proceed with surgery at this point due to recurrent nature of symptoms.  Discussed the risk of surgery including post-op infxn, seroma, biloma, chronic pain, poor-delayed wound healing, retained gallstone, conversion to open procedure, post-op SBO or ileus, and need for additional procedures to address said risks.  The risks of general anesthetic including MI, CVA, sudden death or even reaction to anesthetic medications also discussed. Alternatives include continued observation.  Benefits include possible symptom  relief, prevention of complications including acute cholecystitis, pancreatitis.  Typical post operative recovery of 3-5 days rest, continued pain in area and incision sites, possible loose stools up to 4-6 weeks, also discussed.  She will be at higher risk of said complications due to her excess weight.  Pt and  family verbalized understanding, wishes to proceed. Will schedule  F/u prn

## 2022-04-19 DIAGNOSIS — K8050 Calculus of bile duct without cholangitis or cholecystitis without obstruction: Secondary | ICD-10-CM

## 2022-04-19 HISTORY — DX: Calculus of bile duct without cholangitis or cholecystitis without obstruction: K80.50

## 2022-05-10 ENCOUNTER — Encounter
Admission: RE | Admit: 2022-05-10 | Discharge: 2022-05-10 | Disposition: A | Discharge status: Home / Self Care | Payer: Managed Care, Other (non HMO) | Source: Ambulatory Visit | Attending: Surgery | Admitting: Surgery

## 2022-05-10 VITALS — Ht 70.0 in | Wt 368.0 lb

## 2022-05-10 DIAGNOSIS — Z01812 Encounter for preprocedural laboratory examination: Secondary | ICD-10-CM

## 2022-05-10 HISTORY — DX: Prediabetes: R73.03

## 2022-05-10 HISTORY — DX: Body mass index (BMI) 50.0-59.9, adult: E66.01

## 2022-05-10 HISTORY — DX: Depression, unspecified: F32.A

## 2022-05-10 HISTORY — DX: Vitamin D deficiency, unspecified: E55.9

## 2022-05-10 HISTORY — DX: Deficiency of other specified B group vitamins: E53.8

## 2022-05-10 HISTORY — DX: Gestational (pregnancy-induced) hypertension without significant proteinuria, unspecified trimester: O13.9

## 2022-05-10 NOTE — Patient Instructions (Addendum)
Your procedure is scheduled on: Friday, December 1 Report to the Registration Desk on the 1st floor of the [facility] To find out your arrival time, please call [phone] between 1PM - 3PM on: Thursday, November 30 If your arrival time is 6:00 am, do not arrive prior to that time as the [facility] entrance doors do not open until 6:00 am.  REMEMBER: Instructions that are not followed completely may result in serious medical risk, up to and including death; or upon the discretion of your surgeon and anesthesiologist your surgery may need to be rescheduled.  Do not eat food after midnight the night before surgery.  No gum chewing, lozengers or hard candies.  You may however, drink CLEAR liquids up to 2 hours before you are scheduled to arrive for your surgery. Do not drink anything within 2 hours of your scheduled arrival time.  Clear liquids include: - water  - apple juice without pulp - gatorade (not RED colors) - black coffee or tea (Do NOT add milk or creamers to the coffee or tea) Do NOT drink anything that is not on this list.  TAKE THESE MEDICATIONS THE MORNING OF SURGERY WITH A SIP OF WATER:  Famotidine (Pepcid)  One week prior to surgery: starting November 24 Stop Anti-inflammatories (NSAIDS) such as Advil, Aleve, Ibuprofen, Motrin, Naproxen, Naprosyn and Aspirin based products such as Excedrin, Goodys Powder, BC Powder. Stop ANY OVER THE COUNTER supplements until after surgery. You may however, continue to take Tylenol if needed for pain up until the day of surgery.  No Alcohol for 24 hours before or after surgery.  No Smoking including e-cigarettes for 24 hours prior to surgery.  No chewable tobacco products for at least 6 hours prior to surgery.  No nicotine patches on the day of surgery.  Do not use any "recreational" drugs for at least a week prior to your surgery.  Please be advised that the combination of cocaine and anesthesia may have negative outcomes,  up to and including death. If you test positive for cocaine, your surgery will be cancelled.  On the morning of surgery brush your teeth with toothpaste and water, you may rinse your mouth with mouthwash if you wish. Do not swallow any toothpaste or mouthwash.  Use CHG Soap as directed on instruction sheet.  Do not wear jewelry, make-up, hairpins, clips or nail polish.  Do not wear lotions, powders, or perfumes.   Do not shave body from the neck down 48 hours prior to surgery just in case you cut yourself which could leave a site for infection.  Also, freshly shaved skin may become irritated if using the CHG soap.  Contact lenses, hearing aids and dentures may not be worn into surgery.  Do not bring valuables to the hospital. [facility] is not responsible for any missing/lost belongings or valuables.   Notify your doctor if there is any change in your medical condition (cold, fever, infection).  Wear comfortable clothing (specific to your surgery type) to the hospital.  After surgery, you can help prevent lung complications by doing breathing exercises.  Take deep breaths and cough every 1-2 hours. Your doctor may order a device called an Incentive Spirometer to help you take deep breaths. When coughing or sneezing, hold a pillow firmly against your incision with both hands. This is called "splinting." Doing this helps protect your incision. It also decreases belly discomfort.  If you are being discharged the day of surgery, you will not be allowed  to drive home. You will need a responsible adult (18 years or older) to drive you home and stay with you that night.   If you are taking public transportation, you will need to have a responsible adult (18 years or older) with you. Please confirm with your physician that it is acceptable to use public transportation.   Please call the [place] [facility] at [phone] if you have any questions about these  instructions.  Surgery Visitation Policy:  Patients undergoing a surgery or procedure may have two family members or support persons with them as long as the person is not COVID-19 positive or experiencing its symptoms.      Preparing for Surgery with CHLORHEXIDINE GLUCONATE (CHG) Soap  Chlorhexidine Gluconate (CHG) Soap  o An antiseptic cleaner that kills germs and bonds with the skin to continue killing germs even after washing  o Used for showering the night before surgery and morning of surgery  Before surgery, you can play an important role by reducing the number of germs on your skin.  CHG (Chlorhexidine gluconate) soap is an antiseptic cleanser which kills germs and bonds with the skin to continue killing germs even after washing.  Please do not use if you have an allergy to CHG or antibacterial soaps. If your skin becomes reddened/irritated stop using the CHG.  1. Shower the NIGHT BEFORE SURGERY and the MORNING OF SURGERY with CHG soap.  2. If you choose to wash your hair, wash your hair first as usual with your normal shampoo.  3. After shampooing, rinse your hair and body thoroughly to remove the shampoo.  4. Use CHG as you would any other liquid soap. You can apply CHG directly to the skin and wash gently with a scrungie or a clean washcloth.  5. Apply the CHG soap to your body only from the neck down. Do not use on open wounds or open sores. Avoid contact with your eyes, ears, mouth, and genitals (private parts). Wash face and genitals (private parts) with your normal soap.  6. Wash thoroughly, paying special attention to the area where your surgery will be performed.  7. Thoroughly rinse your body with warm water.  8. Do not shower/wash with your normal soap after using and rinsing off the CHG soap.  9. Pat yourself dry with a clean towel.  10. Wear clean pajamas to bed the night before surgery.  12. Place clean sheets on your bed the night of your first shower  and do not sleep with pets.  13. Shower again with the CHG soap on the day of surgery prior to arriving at the hospital.  14. Do not apply any deodorants/lotions/powders.  15. Please wear clean clothes to the hospital.

## 2022-05-19 ENCOUNTER — Encounter
Admission: RE | Disposition: A | Discharge status: Home / Self Care | Payer: Self-pay | Source: Home / Self Care | Attending: Surgery

## 2022-05-19 ENCOUNTER — Ambulatory Visit: Payer: Managed Care, Other (non HMO) | Admitting: Anesthesiology

## 2022-05-19 ENCOUNTER — Encounter: Payer: Self-pay | Admitting: Surgery

## 2022-05-19 ENCOUNTER — Other Ambulatory Visit: Payer: Self-pay

## 2022-05-19 ENCOUNTER — Ambulatory Visit
Admission: RE | Admit: 2022-05-19 | Discharge: 2022-05-19 | Disposition: A | Discharge status: Home / Self Care | Payer: Managed Care, Other (non HMO) | Attending: Surgery | Admitting: Surgery

## 2022-05-19 DIAGNOSIS — K219 Gastro-esophageal reflux disease without esophagitis: Secondary | ICD-10-CM | POA: Diagnosis not present

## 2022-05-19 DIAGNOSIS — E669 Obesity, unspecified: Secondary | ICD-10-CM | POA: Insufficient documentation

## 2022-05-19 DIAGNOSIS — K8050 Calculus of bile duct without cholangitis or cholecystitis without obstruction: Secondary | ICD-10-CM | POA: Diagnosis present

## 2022-05-19 DIAGNOSIS — F32A Depression, unspecified: Secondary | ICD-10-CM | POA: Insufficient documentation

## 2022-05-19 DIAGNOSIS — K811 Chronic cholecystitis: Secondary | ICD-10-CM

## 2022-05-19 DIAGNOSIS — F419 Anxiety disorder, unspecified: Secondary | ICD-10-CM | POA: Diagnosis not present

## 2022-05-19 DIAGNOSIS — K8060 Calculus of gallbladder and bile duct with cholecystitis, unspecified, without obstruction: Secondary | ICD-10-CM | POA: Insufficient documentation

## 2022-05-19 DIAGNOSIS — R7303 Prediabetes: Secondary | ICD-10-CM | POA: Insufficient documentation

## 2022-05-19 DIAGNOSIS — Z6841 Body Mass Index (BMI) 40.0 and over, adult: Secondary | ICD-10-CM | POA: Diagnosis not present

## 2022-05-19 DIAGNOSIS — Z01812 Encounter for preprocedural laboratory examination: Secondary | ICD-10-CM

## 2022-05-19 LAB — POCT PREGNANCY, URINE: Preg Test, Ur: NEGATIVE

## 2022-05-19 SURGERY — CHOLECYSTECTOMY, ROBOT-ASSISTED, LAPAROSCOPIC
Anesthesia: General | Site: Abdomen

## 2022-05-19 MED ORDER — LACTATED RINGERS IV SOLN: INTRAVENOUS | Status: DC

## 2022-05-19 MED ORDER — FENTANYL CITRATE (PF) 100 MCG/2ML IJ SOLN
INTRAMUSCULAR | Status: AC
  Filled 2022-05-19: qty 2

## 2022-05-19 MED ORDER — LIDOCAINE HCL (CARDIAC) PF 100 MG/5ML IV SOSY
PREFILLED_SYRINGE | INTRAVENOUS | Status: DC | PRN
  Administered 2022-05-19: 100 mg via INTRAVENOUS

## 2022-05-19 MED ORDER — FENTANYL CITRATE (PF) 100 MCG/2ML IJ SOLN
INTRAMUSCULAR | Status: DC | PRN
  Administered 2022-05-19 (×4): 50 ug via INTRAVENOUS

## 2022-05-19 MED ORDER — ORAL CARE MOUTH RINSE: 15.0000 mL | Freq: Once | OROMUCOSAL | Status: AC

## 2022-05-19 MED ORDER — ROCURONIUM BROMIDE 10 MG/ML (PF) SYRINGE
PREFILLED_SYRINGE | INTRAVENOUS | Status: AC
  Filled 2022-05-19: qty 10

## 2022-05-19 MED ORDER — SUCCINYLCHOLINE CHLORIDE 200 MG/10ML IV SOSY
PREFILLED_SYRINGE | INTRAVENOUS | Status: DC | PRN
  Administered 2022-05-19: 200 mg via INTRAVENOUS

## 2022-05-19 MED ORDER — INDOCYANINE GREEN 25 MG IV SOLR
1.2500 mg | Freq: Once | INTRAVENOUS | Status: AC
  Administered 2022-05-19: 1.25 mg via INTRAVENOUS
  Filled 2022-05-19: qty 0.5

## 2022-05-19 MED ORDER — PROPOFOL 10 MG/ML IV BOLUS
INTRAVENOUS | Status: DC | PRN
  Administered 2022-05-19: 200 mg via INTRAVENOUS

## 2022-05-19 MED ORDER — OXYCODONE HCL 5 MG/5ML PO SOLN: 5.0000 mg | Freq: Once | ORAL | Status: AC | PRN

## 2022-05-19 MED ORDER — IBUPROFEN 800 MG PO TABS: 800.0000 mg | ORAL_TABLET | Freq: Three times a day (TID) | ORAL | 0 refills | Status: AC | PRN

## 2022-05-19 MED ORDER — BUPIVACAINE HCL (PF) 0.5 % IJ SOLN
INTRAMUSCULAR | Status: AC
  Filled 2022-05-19: qty 30

## 2022-05-19 MED ORDER — CHLORHEXIDINE GLUCONATE 0.12 % MT SOLN: 15.0000 mL | Freq: Once | OROMUCOSAL | Status: AC

## 2022-05-19 MED ORDER — ROCURONIUM BROMIDE 100 MG/10ML IV SOLN
INTRAVENOUS | Status: DC | PRN
  Administered 2022-05-19: 50 mg via INTRAVENOUS
  Administered 2022-05-19: 30 mg via INTRAVENOUS

## 2022-05-19 MED ORDER — SUCCINYLCHOLINE CHLORIDE 200 MG/10ML IV SOSY
PREFILLED_SYRINGE | INTRAVENOUS | Status: AC
  Filled 2022-05-19: qty 10

## 2022-05-19 MED ORDER — OXYCODONE HCL 5 MG PO TABS: 5.0000 mg | ORAL_TABLET | Freq: Once | ORAL | Status: AC | PRN

## 2022-05-19 MED ORDER — MIDAZOLAM HCL 2 MG/2ML IJ SOLN
INTRAMUSCULAR | Status: DC | PRN
  Administered 2022-05-19: 2 mg via INTRAVENOUS

## 2022-05-19 MED ORDER — SUGAMMADEX SODIUM 500 MG/5ML IV SOLN
INTRAVENOUS | Status: AC
  Filled 2022-05-19: qty 5

## 2022-05-19 MED ORDER — HYDROMORPHONE HCL 1 MG/ML IJ SOLN
0.2500 mg | INTRAMUSCULAR | Status: DC | PRN
  Administered 2022-05-19 (×2): 0.25 mg via INTRAVENOUS

## 2022-05-19 MED ORDER — SUGAMMADEX SODIUM 500 MG/5ML IV SOLN
INTRAVENOUS | Status: DC | PRN
  Administered 2022-05-19: 380 mg via INTRAVENOUS

## 2022-05-19 MED ORDER — KETAMINE HCL 10 MG/ML IJ SOLN
INTRAMUSCULAR | Status: DC | PRN
  Administered 2022-05-19: 30 mg via INTRAVENOUS
  Administered 2022-05-19: 20 mg via INTRAVENOUS

## 2022-05-19 MED ORDER — CEFAZOLIN IN SODIUM CHLORIDE 3-0.9 GM/100ML-% IV SOLN
3.0000 g | INTRAVENOUS | Status: AC
  Administered 2022-05-19: 3 g via INTRAVENOUS
  Filled 2022-05-19: qty 100

## 2022-05-19 MED ORDER — HYDROCODONE-ACETAMINOPHEN 5-325 MG PO TABS: 1.0000 | ORAL_TABLET | Freq: Four times a day (QID) | ORAL | 0 refills | Status: AC | PRN

## 2022-05-19 MED ORDER — PROPOFOL 10 MG/ML IV BOLUS
INTRAVENOUS | Status: AC
  Filled 2022-05-19: qty 20

## 2022-05-19 MED ORDER — ACETAMINOPHEN 325 MG PO TABS: 650.0000 mg | ORAL_TABLET | Freq: Three times a day (TID) | ORAL | 0 refills | Status: AC | PRN

## 2022-05-19 MED ORDER — DEXAMETHASONE SODIUM PHOSPHATE 10 MG/ML IJ SOLN
INTRAMUSCULAR | Status: DC | PRN
  Administered 2022-05-19: 10 mg via INTRAVENOUS

## 2022-05-19 MED ORDER — KETAMINE HCL 50 MG/5ML IJ SOSY
PREFILLED_SYRINGE | INTRAMUSCULAR | Status: AC
  Filled 2022-05-19: qty 5

## 2022-05-19 MED ORDER — LIDOCAINE-EPINEPHRINE (PF) 1 %-1:200000 IJ SOLN
INTRAMUSCULAR | Status: AC
  Filled 2022-05-19: qty 30

## 2022-05-19 MED ORDER — MIDAZOLAM HCL 2 MG/2ML IJ SOLN
INTRAMUSCULAR | Status: AC
  Filled 2022-05-19: qty 2

## 2022-05-19 MED ORDER — ONDANSETRON HCL 4 MG/2ML IJ SOLN
INTRAMUSCULAR | Status: AC
  Filled 2022-05-19: qty 2

## 2022-05-19 MED ORDER — LIDOCAINE HCL (PF) 2 % IJ SOLN
INTRAMUSCULAR | Status: AC
  Filled 2022-05-19: qty 5

## 2022-05-19 MED ORDER — OXYCODONE HCL 5 MG PO TABS
ORAL_TABLET | ORAL | Status: AC
  Administered 2022-05-19: 5 mg via ORAL
  Filled 2022-05-19: qty 1

## 2022-05-19 MED ORDER — CHLORHEXIDINE GLUCONATE CLOTH 2 % EX PADS: 6.0000 | MEDICATED_PAD | Freq: Once | CUTANEOUS | Status: DC

## 2022-05-19 MED ORDER — LIDOCAINE-EPINEPHRINE (PF) 1 %-1:200000 IJ SOLN
INTRAMUSCULAR | Status: DC | PRN
  Administered 2022-05-19: 47 mL via INTRAMUSCULAR

## 2022-05-19 MED ORDER — HYDROMORPHONE HCL 1 MG/ML IJ SOLN
INTRAMUSCULAR | Status: AC
  Administered 2022-05-19: 0.25 mg via INTRAVENOUS
  Filled 2022-05-19: qty 1

## 2022-05-19 MED ORDER — CHLORHEXIDINE GLUCONATE 0.12 % MT SOLN
OROMUCOSAL | Status: AC
  Administered 2022-05-19: 15 mL via OROMUCOSAL
  Filled 2022-05-19: qty 15

## 2022-05-19 SURGICAL SUPPLY — 53 items
ANCHOR TIS RET SYS 235ML (MISCELLANEOUS) ×2 IMPLANT
BAG PRESSURE INF REUSE 1000 (BAG) IMPLANT
BLADE SURG SZ11 CARB STEEL (BLADE) ×2 IMPLANT
CANNULA REDUC XI 12-8 STAPL (CANNULA) ×2
CANNULA REDUCER 12-8 DVNC XI (CANNULA) ×2 IMPLANT
CATH REDDICK CHOLANGI 4FR 50CM (CATHETERS) IMPLANT
CLIP LIGATING HEMO O LOK GREEN (MISCELLANEOUS) ×2 IMPLANT
DERMABOND ADVANCED .7 DNX12 (GAUZE/BANDAGES/DRESSINGS) ×2 IMPLANT
DRAPE ARM DVNC X/XI (DISPOSABLE) ×8 IMPLANT
DRAPE C-ARM XRAY 36X54 (DRAPES) IMPLANT
DRAPE COLUMN DVNC XI (DISPOSABLE) ×2 IMPLANT
DRAPE DA VINCI XI ARM (DISPOSABLE) ×8
DRAPE DA VINCI XI COLUMN (DISPOSABLE) ×2
ELECT CAUTERY BLADE 6.4 (BLADE) ×2 IMPLANT
ELECT REM PT RETURN 9FT ADLT (ELECTROSURGICAL) ×2
ELECTRODE REM PT RTRN 9FT ADLT (ELECTROSURGICAL) ×2 IMPLANT
GLOVE BIOGEL PI IND STRL 7.0 (GLOVE) ×4 IMPLANT
GLOVE SURG SYN 6.5 ES PF (GLOVE) ×16 IMPLANT
GLOVE SURG SYN 6.5 PF PI (GLOVE) ×4 IMPLANT
GOWN STRL REUS W/ TWL LRG LVL3 (GOWN DISPOSABLE) ×6 IMPLANT
GOWN STRL REUS W/TWL LRG LVL3 (GOWN DISPOSABLE) ×8
GRASPER SUT TROCAR 14GX15 (MISCELLANEOUS) IMPLANT
IRRIGATOR SUCT 8 DISP DVNC XI (IRRIGATION / IRRIGATOR) IMPLANT
IRRIGATOR SUCTION 8MM XI DISP (IRRIGATION / IRRIGATOR)
IV NS 1000ML (IV SOLUTION)
IV NS 1000ML BAXH (IV SOLUTION) IMPLANT
LABEL OR SOLS (LABEL) ×2 IMPLANT
MANIFOLD NEPTUNE II (INSTRUMENTS) ×2 IMPLANT
NDL INSUFFLATION 14GA 120MM (NEEDLE) ×2 IMPLANT
NEEDLE HYPO 22GX1.5 SAFETY (NEEDLE) ×2 IMPLANT
NEEDLE INSUFFLATION 14GA 120MM (NEEDLE) ×2 IMPLANT
NS IRRIG 500ML POUR BTL (IV SOLUTION) ×2 IMPLANT
OBTURATOR OPTICAL STANDARD 8MM (TROCAR) ×2
OBTURATOR OPTICAL STND 8 DVNC (TROCAR) ×2
OBTURATOR OPTICALSTD 8 DVNC (TROCAR) ×2 IMPLANT
PACK LAP CHOLECYSTECTOMY (MISCELLANEOUS) ×2 IMPLANT
PENCIL SMOKE EVACUATOR (MISCELLANEOUS) ×2 IMPLANT
SEAL CANN UNIV 5-8 DVNC XI (MISCELLANEOUS) ×6 IMPLANT
SEAL XI 5MM-8MM UNIVERSAL (MISCELLANEOUS) ×8
SET TUBE SMOKE EVAC HIGH FLOW (TUBING) ×2 IMPLANT
SOLUTION ELECTROLUBE (MISCELLANEOUS) ×2 IMPLANT
SPIKE FLUID TRANSFER (MISCELLANEOUS) ×4 IMPLANT
STAPLER CANNULA SEAL DVNC XI (STAPLE) ×2 IMPLANT
STAPLER CANNULA SEAL XI (STAPLE) ×2
SUT MNCRL 4-0 (SUTURE) ×4
SUT MNCRL 4-0 27XMFL (SUTURE) ×4
SUT VICRYL 0 UR6 27IN ABS (SUTURE) ×2 IMPLANT
SUTURE MNCRL 4-0 27XMF (SUTURE) ×4 IMPLANT
SYR 30ML LL (SYRINGE) IMPLANT
SYSTEM WECK SHIELD CLOSURE (TROCAR) IMPLANT
TRAP FLUID SMOKE EVACUATOR (MISCELLANEOUS) ×2 IMPLANT
TROCAR 5M 150ML BLDLS (TROCAR) IMPLANT
WATER STERILE IRR 500ML POUR (IV SOLUTION) ×2 IMPLANT

## 2022-05-19 NOTE — H&P (Signed)
Subjective:   CC: Biliary colic [K80.50]  HPI: Rachel Shaw is a 36 y.o. female who returns for evaluation of above CC. Had another episode prompting ED visit, pain resolved with no signs of acute cholecystitis. Did have dilated CBD and elevated LFTs at that time.   Past Medical History: has a past medical history of GERD (gastroesophageal reflux disease) and Seizures (CMS-HCC).  Past Surgical History: has a past surgical history that includes Wisdom teeth and Cystectomy.  Family History: family history includes Migraines in her brother and mother.  Social History: reports that she has never smoked. She has never used smokeless tobacco. She reports that she does not currently use alcohol. She reports that she does not currently use drugs.  Current Medications: has a current medication list which includes the following prescription(s): escitalopram oxalate, omeprazole, acetazolamide, escitalopram oxalate, famotidine, ibuprofen, norethindrone, and prenatal vitamin with iron-folic acid.  Allergies:  Allergies as of 04/05/2022 - Reviewed 04/05/2022  Allergen Reaction Noted  Phenazopyridine Hives 03/10/2013   ROS:  A 15 point review of systems was performed and pertinent positives and negatives noted in HPI   Objective:    BP 122/89 (BP Location: Left upper arm, Patient Position: Sitting)  Pulse 88  Ht 177.8 cm (5\' 10" ) Comment: per chart  Wt (!) 160.6 kg (354 lb) Comment: per chart  BMI 50.79 kg/m   Constitutional : alert, appears stated age, cooperative and no distress  Lymphatics/Throat: no asymmetry, masses, or scars  Respiratory: clear to auscultation bilaterally  Cardiovascular: regular rate and rhythm  Gastrointestinal: soft, non-tender; bowel sounds normal; no masses, no organomegaly.  Musculoskeletal: Steady gait and movement  Skin: Cool and moist  Psychiatric: Normal affect, non-agitated, not confused    LABS:  n/a   RADS: CLINICAL DATA: Initial evaluation for  acute right upper quadrant  pain.   EXAM:  ULTRASOUND ABDOMEN LIMITED RIGHT UPPER QUADRANT   COMPARISON: Prior CT from 03/12/2018.   FINDINGS:  Gallbladder:   Shadowing echogenic stones seen within the gallbladder lumen,  largest of which measures 12 mm. Gallbladder wall measures within  normal limits at 2.5 mm. No free pericholecystic fluid. No  sonographic Murphy sign elicited on exam.   Common bile duct:   Diameter: 3 mm   Liver:   No focal lesion identified. Within normal limits in parenchymal  echogenicity. Portal vein is patent on color Doppler imaging with  normal direction of blood flow towards the liver.   Other: None.   IMPRESSION:  1. Cholelithiasis. No sonographic features to suggest acute  cholecystitis.  2. No biliary dilatation.   Electronically Signed  By: 03/14/2018 M.D.  On: 07/07/2020 03:45  Assessment:    Biliary colic [K80.50]  Obesity  Plan:    1. Biliary colic [K80.50] We again discussed pathophysiology of biliary colic and also potential complications. She will like to proceed with surgery at this point due to recurrent nature of symptoms.  Discussed the risk of surgery including post-op infxn, seroma, biloma, chronic pain, poor-delayed wound healing, retained gallstone, conversion to open procedure, post-op SBO or ileus, and need for additional procedures to address said risks. The risks of general anesthetic including MI, CVA, sudden death or even reaction to anesthetic medications also discussed. Alternatives include continued observation. Benefits include possible symptom relief, prevention of complications including acute cholecystitis, pancreatitis.  Typical post operative recovery of 3-5 days rest, continued pain in area and incision sites, possible loose stools up to 4-6 weeks, also discussed. She will be at higher  risk of said complications due to her excess weight.  Pt and family verbalized understanding, wishes to  proceed. Will schedule

## 2022-05-19 NOTE — Op Note (Signed)
Preoperative diagnosis:  chronic and cholecystitis  Postoperative diagnosis: same as above  Procedure: Robotic assisted Laparoscopic Cholecystectomy.   Anesthesia: GETA   Surgeon: Benjamine Sprague  Specimen: Gallbladder  Complications: None  EBL: 36mL  Wound Classification: Clean Contaminated  Indications: see HPI  Findings: Critical view of safety noted Cystic duct and artery identified, ligated and divided, clips remained intact at end of procedure Adequate hemostasis  Description of procedure:  The patient was placed on the operating table in the supine position. SCDs placed, pre-op abx administered.  General anesthesia was induced and OG tube placed by anesthesia. A time-out was completed verifying correct patient, procedure, site, positioning, and implant(s) and/or special equipment prior to beginning this procedure. The abdomen was prepped and draped in the usual sterile fashion.    Veress needle was placed at the Palmer's point and attempted to enter intra-abdominal cavity.  Due to the extensively thick abdominal wall this was unsuccessful.  Bariatric 5 mm trocar was then placed through the Palmer's point and abdomen entered via Optiview technique.  Inspection of the abdominal cavity noted no injury during placement or with the multiple attempts with the Veress needle.  Under direct vision, ports were placed in the following locations: One 12 mm patient left of the umbilicus, 8cm from the optiviewed port, one 8 mm port placed to the patient right of the umbilical port 8 cm apart.  1 additional 8 mm port placed lateral to the 23mm port.  Once ports were placed, The table was placed in the reverse Trendelenburg position with the right side up. The Xi platform was brought into the operative field and docked to the ports successfully.  An endoscope was placed through the umbilical port, fenestrated grasper through the adjacent patient right port, prograsp to the far patient left port, and  then a hook cautery in the left port.  The left port retractor noted to not be in a ideal spot to reach the gallbladder, again due to her body habitus.  An additional bariatric 8 mm port on the right side was placed to facilitate retraction of the gallbladder.  The dome of the gallbladder was grasped with prograsp, passed and retracted over the dome of the liver. Adhesions between the gallbladder and omentum, duodenum and transverse colon were lysed via hook cautery. The infundibulum was grasped with the fenestrated grasper and retracted toward the right lower quadrant. This maneuver exposed Calot's triangle. The peritoneum overlying the gallbladder infundibulum was then dissected  and the cystic duct and cystic artery identified.  Critical view of safety with the liver bed clearly visible behind the duct and artery with no additional structures noted.  The cystic duct and cystic artery clipped and divided close to the gallbladder.     The gallbladder was then dissected from its peritoneal and liver bed attachments by electrocautery. Hemostasis was checked prior to removing the hook cautery and the Endo Catch bag was then placed through the 12 mm port and the gallbladder was removed.  The gallbladder was passed off the table as a specimen. There was no evidence of bleeding from the gallbladder fossa or cystic artery or leakage of the bile from the cystic duct stump. The 12 mm port site closed with EfxShield using 0 vicryl under direct vision.  Abdomen desufflated and secondary trocars were removed under direct vision. No bleeding was noted. All skin incisions then closed with subcuticular sutures of 4-0 monocryl and dressed with topical skin adhesive. The orogastric tube was removed and patient  extubated.  The patient tolerated the procedure well and was taken to the postanesthesia care unit in stable condition.  All sponge and instrument count correct at end of procedure.

## 2022-05-19 NOTE — Anesthesia Procedure Notes (Signed)
Procedure Name: Intubation Date/Time: 05/19/2022 10:54 AM  Performed by: Berniece Pap, CRNAPre-anesthesia Checklist: Patient identified, Emergency Drugs available, Suction available and Patient being monitored Patient Re-evaluated:Patient Re-evaluated prior to induction Oxygen Delivery Method: Circle system utilized Preoxygenation: Pre-oxygenation with 100% oxygen Induction Type: IV induction Ventilation: Mask ventilation without difficulty Laryngoscope Size: McGraph and 4 Grade View: Grade I Tube type: Oral Tube size: 7.0 mm Number of attempts: 1 Airway Equipment and Method: Stylet and Oral airway Placement Confirmation: ETT inserted through vocal cords under direct vision, positive ETCO2 and breath sounds checked- equal and bilateral Secured at: 21 cm Tube secured with: Tape Dental Injury: Teeth and Oropharynx as per pre-operative assessment

## 2022-05-19 NOTE — Discharge Instructions (Addendum)
Laparoscopic Cholecystectomy, Care After This sheet gives you information about how to care for yourself after your procedure. Your doctor may also give you more specific instructions. If you have problems or questions, contact your doctor. Follow these instructions at home: Care for cuts from surgery (incisions)  Follow instructions from your doctor about how to take care of your cuts from surgery. Make sure you: Wash your hands with soap and water before you change your bandage (dressing). If you cannot use soap and water, use hand sanitizer. Change your bandage as told by your doctor. Leave stitches (sutures), skin glue, or skin tape (adhesive) strips in place. They may need to stay in place for 2 weeks or longer. If tape strips get loose and curl up, you may trim the loose edges. Do not remove tape strips completely unless your doctor says it is okay. Do not take baths, swim, or use a hot tub until your doctor says it is okay. OK TO SHOWER 24HRS AFTER YOUR SURGERY.  Check your surgical cut area every day for signs of infection. Check for: More redness, swelling, or pain. More fluid or blood. Warmth. Pus or a bad smell. Activity Do not drive or use heavy machinery while taking prescription pain medicine. Do not play contact sports until your doctor says it is okay. Do not drive for 24 hours if you were given a medicine to help you relax (sedative). Rest as needed. Do not return to work or school until your doctor says it is okay. General instructions  tylenol and advil as needed for discomfort.  Please alternate between the two every four hours as needed for pain.    Use narcotics, if prescribed, only when tylenol and motrin is not enough to control pain.  325-650mg every 8hrs to max of 3000mg/24hrs (including the 325mg in every norco dose) for the tylenol.    Advil up to 800mg per dose every 8hrs as needed for pain.   To prevent or treat constipation while you are taking prescription  pain medicine, your doctor may recommend that you: Drink enough fluid to keep your pee (urine) clear or pale yellow. Take over-the-counter or prescription medicines. Eat foods that are high in fiber, such as fresh fruits and vegetables, whole grains, and beans. Limit foods that are high in fat and processed sugars, such as fried and sweet foods. Contact a doctor if: You develop a rash. You have more redness, swelling, or pain around your surgical cuts. You have more fluid or blood coming from your surgical cuts. Your surgical cuts feel warm to the touch. You have pus or a bad smell coming from your surgical cuts. You have a fever. One or more of your surgical cuts breaks open. You have trouble breathing. You have chest pain. You have pain that is getting worse in your shoulders. You faint or feel dizzy when you stand. You have very bad pain in your belly (abdomen). You are sick to your stomach (nauseous) for more than one day. You have throwing up (vomiting) that lasts for more than one day. You have leg pain. This information is not intended to replace advice given to you by your health care provider. Make sure you discuss any questions you have with your health care provider. Document Released: 03/14/2008 Document Revised: 12/25/2015 Document Reviewed: 11/22/2015 Elsevier Interactive Patient Education  2019 [employer]   AMBULATORY SURGERY  DISCHARGE INSTRUCTIONS   The drugs that you were given will stay in your system until tomorrow so   for the next 24 hours you should not:  Drive an automobile Make any legal decisions Drink any alcoholic beverage   You may resume regular meals tomorrow.  Today it is better to start with liquids and gradually work up to solid foods.  You may eat anything you prefer, but it is better to start with liquids, then soup and crackers, and gradually work up to solid foods.   Please notify your doctor immediately if you have any unusual  bleeding, trouble breathing, redness and pain at the surgery site, drainage, fever, or pain not relieved by medication.    Your post-operative visit with Dr.                                       is: Date:                        Time:    Please call to schedule your post-operative visit.  Additional Instructions: 

## 2022-05-19 NOTE — Anesthesia Preprocedure Evaluation (Signed)
Anesthesia Evaluation  Patient identified by MRN, date of birth, ID band Patient awake    Reviewed: Allergy & Precautions, NPO status , Patient's Chart, lab work & pertinent test results  History of Anesthesia Complications (+) history of anesthetic complications  Airway Mallampati: II  TM Distance: >3 FB Neck ROM: full    Dental  (+) Teeth Intact   Pulmonary neg pulmonary ROS   Pulmonary exam normal        Cardiovascular negative cardio ROS Normal cardiovascular exam     Neuro/Psych  PSYCHIATRIC DISORDERS Anxiety Depression    negative neurological ROS     GI/Hepatic Neg liver ROS,GERD  Medicated,,  Endo/Other    Morbid obesity  Renal/GU      Musculoskeletal   Abdominal  (+) + obese  Peds  Hematology negative hematology ROS (+)   Anesthesia Other Findings Past Medical History: No date: Anxiety 04/2022: Biliary colic No date: Depression No date: GERD (gastroesophageal reflux disease) No date: Gestational hypertension No date: Morbid obesity with BMI of 50.0-59.9, adult (HCC) No date: Pre-diabetes No date: PVC (premature ventricular contraction) No date: Vitamin B12 deficiency No date: Vitamin D deficiency  Past Surgical History: 2016: ESOPHAGOGASTRODUODENOSCOPY 2020: SIGMOIDOSCOPY     Reproductive/Obstetrics negative OB ROS                              Anesthesia Physical Anesthesia Plan  ASA: 3  Anesthesia Plan: General ETT   Post-op Pain Management: Toradol IV (intra-op)*, Ofirmev IV (intra-op)* and Dilaudid IV   Induction: Intravenous  PONV Risk Score and Plan: Ondansetron, Dexamethasone, Midazolam and Treatment may vary due to age or medical condition  Airway Management Planned: Oral ETT  Additional Equipment:   Intra-op Plan:   Post-operative Plan: Extubation in OR  Informed Consent: I have reviewed the patients History and Physical, chart, labs and  discussed the procedure including the risks, benefits and alternatives for the proposed anesthesia with the patient or authorized representative who has indicated his/her understanding and acceptance.     Dental Advisory Given  Plan Discussed with: Anesthesiologist, CRNA and Surgeon  Anesthesia Plan Comments: (Patient consented for risks of anesthesia including but not limited to:  - adverse reactions to medications - damage to eyes, teeth, lips or other oral mucosa - nerve damage due to positioning  - sore throat or hoarseness - Damage to heart, brain, nerves, lungs, other parts of body or loss of life  Patient voiced understanding.)         Anesthesia Quick Evaluation

## 2022-05-19 NOTE — Transfer of Care (Signed)
Immediate Anesthesia Transfer of Care Note  Patient: Rachel Shaw  Procedure(s) Performed: XI ROBOTIC ASSISTED LAPAROSCOPIC CHOLECYSTECTOMY (Abdomen) INDOCYANINE GREEN FLUORESCENCE IMAGING (ICG)  Patient Location: PACU  Anesthesia Type:General  Level of Consciousness: awake, alert , and oriented  Airway & Oxygen Therapy: Patient Spontanous Breathing and Patient connected to nasal cannula oxygen  Post-op Assessment: Report given to RN and Post -op Vital signs reviewed and stable  Post vital signs: Reviewed and stable  Last Vitals:  Vitals Value Taken Time  BP 160/93 05/19/22 1245  Temp    Pulse 109 05/19/22 1248  Resp 23 05/19/22 1248  SpO2 100 % 05/19/22 1248  Vitals shown include unvalidated device data.  Last Pain:  Vitals:   05/19/22 1000  TempSrc: Tympanic  PainSc: 0-No pain         Complications: No notable events documented.

## 2022-05-19 NOTE — Interval H&P Note (Signed)
History and Physical Interval Note:  05/19/2022 10:17 AM  Rachel Shaw  has presented today for surgery, with the diagnosis of Biliary colic K80.50.  The various methods of treatment have been discussed with the patient and family. After consideration of risks, benefits and other options for treatment, the patient has consented to  Procedure(s): XI ROBOTIC ASSISTED LAPAROSCOPIC CHOLECYSTECTOMY (N/A) as a surgical intervention.  The patient's history has been reviewed, patient examined, no change in status, stable for surgery.  I have reviewed the patient's chart and labs.  Questions were answered to the patient's satisfaction.     Shauntay Brunelli Tonna Boehringer

## 2022-05-19 NOTE — Anesthesia Postprocedure Evaluation (Signed)
Anesthesia Post Note  Patient: Ana-Linn Puskarich  Procedure(s) Performed: XI ROBOTIC ASSISTED LAPAROSCOPIC CHOLECYSTECTOMY (Abdomen) INDOCYANINE GREEN FLUORESCENCE IMAGING (ICG)  Patient location during evaluation: PACU Anesthesia Type: General Level of consciousness: awake and alert Pain management: pain level controlled Vital Signs Assessment: post-procedure vital signs reviewed and stable Respiratory status: spontaneous breathing, nonlabored ventilation, respiratory function stable and patient connected to nasal cannula oxygen Cardiovascular status: blood pressure returned to baseline and stable Postop Assessment: no apparent nausea or vomiting Anesthetic complications: no   No notable events documented.   Last Vitals:  Vitals:   05/19/22 1247 05/19/22 1300  BP: (!) 160/93 (!) 160/100  Pulse:  98  Resp: (!) 28 (!) 25  Temp: 37.2 C   SpO2: 97% 97%    Last Pain:  Vitals:   05/19/22 1300  TempSrc:   PainSc: 4                  Louie Boston

## 2022-05-22 LAB — SURGICAL PATHOLOGY

## 2022-09-25 ENCOUNTER — Emergency Department: Payer: Managed Care, Other (non HMO)

## 2022-09-25 ENCOUNTER — Other Ambulatory Visit: Payer: Self-pay

## 2022-09-25 DIAGNOSIS — Y92137 Garden or yard on military base as the place of occurrence of the external cause: Secondary | ICD-10-CM | POA: Diagnosis not present

## 2022-09-25 DIAGNOSIS — X58XXXA Exposure to other specified factors, initial encounter: Secondary | ICD-10-CM | POA: Insufficient documentation

## 2022-09-25 DIAGNOSIS — Y93H2 Activity, gardening and landscaping: Secondary | ICD-10-CM | POA: Insufficient documentation

## 2022-09-25 DIAGNOSIS — K529 Noninfective gastroenteritis and colitis, unspecified: Secondary | ICD-10-CM | POA: Insufficient documentation

## 2022-09-25 DIAGNOSIS — R197 Diarrhea, unspecified: Secondary | ICD-10-CM | POA: Diagnosis present

## 2022-09-25 LAB — COMPREHENSIVE METABOLIC PANEL
ALT: 25 U/L (ref 0–44)
AST: 29 U/L (ref 15–41)
Albumin: 4 g/dL (ref 3.5–5.0)
Alkaline Phosphatase: 92 U/L (ref 38–126)
Anion gap: 9 (ref 5–15)
BUN: 12 mg/dL (ref 6–20)
CO2: 25 mmol/L (ref 22–32)
Calcium: 8.5 mg/dL — ABNORMAL LOW (ref 8.9–10.3)
Chloride: 105 mmol/L (ref 98–111)
Creatinine, Ser: 0.83 mg/dL (ref 0.44–1.00)
GFR, Estimated: >60 mL/min (ref >60)
Glucose, Bld: 117 mg/dL — ABNORMAL HIGH (ref 70–99)
Potassium: 3.6 mmol/L (ref 3.5–5.1)
Sodium: 139 mmol/L (ref 135–145)
Total Bilirubin: 0.6 mg/dL (ref 0.3–1.2)
Total Protein: 7.5 g/dL (ref 6.5–8.1)

## 2022-09-25 LAB — CBC
HCT: 44.4 % (ref 36.0–46.0)
Hemoglobin: 13.6 g/dL (ref 12.0–15.0)
MCH: 24 pg — ABNORMAL LOW (ref 26.0–34.0)
MCHC: 30.6 g/dL (ref 30.0–36.0)
MCV: 78.3 fL — ABNORMAL LOW (ref 80.0–100.0)
Platelets: 361 10*3/uL (ref 150–400)
RBC: 5.67 MIL/uL — ABNORMAL HIGH (ref 3.87–5.11)
RDW: 15.1 % (ref 11.5–15.5)
WBC: 21.1 10*3/uL — ABNORMAL HIGH (ref 4.0–10.5)
nRBC: 0 % (ref 0.0–0.2)

## 2022-09-25 LAB — LIPASE, BLOOD: Lipase: 26 U/L (ref 11–51)

## 2022-09-25 LAB — URINALYSIS, ROUTINE W REFLEX MICROSCOPIC
Bacteria, UA: NONE SEEN
Bilirubin Urine: NEGATIVE
Glucose, UA: NEGATIVE mg/dL
Ketones, ur: NEGATIVE mg/dL
Nitrite: NEGATIVE
Protein, ur: 100 mg/dL — AB
RBC / HPF: >50 RBC/hpf (ref 0–5)
Specific Gravity, Urine: 1.03 (ref 1.005–1.030)
WBC, UA: NONE SEEN WBC/hpf (ref 0–5)
pH: 5 (ref 5.0–8.0)

## 2022-09-25 LAB — TROPONIN I (HIGH SENSITIVITY): Troponin I (High Sensitivity): 3 ng/L (ref <18)

## 2022-09-25 LAB — POC URINE PREG, ED: Preg Test, Ur: NEGATIVE

## 2022-09-25 MED ORDER — ONDANSETRON 4 MG PO TBDP
4.0000 mg | ORAL_TABLET | Freq: Once | ORAL | Status: AC
  Administered 2022-09-25: 4 mg via ORAL
  Filled 2022-09-25: qty 1

## 2022-09-25 NOTE — ED Triage Notes (Signed)
Pt presents to ER via ems from home with c/o n/v/d, and right side back pain, and chest pain that started today after doing some yard work.  Pt denies recent sick contacts, or recent new food consumption.  Ems states pt has vomited x2 with them.  Pt is otherwise A&O x4 and in NAD in triage.

## 2022-09-26 ENCOUNTER — Emergency Department
Admission: EM | Admit: 2022-09-26 | Discharge: 2022-09-26 | Disposition: A | Discharge status: Home / Self Care | Payer: Managed Care, Other (non HMO) | Attending: Emergency Medicine | Admitting: Emergency Medicine

## 2022-09-26 ENCOUNTER — Emergency Department: Payer: Managed Care, Other (non HMO)

## 2022-09-26 DIAGNOSIS — K529 Noninfective gastroenteritis and colitis, unspecified: Secondary | ICD-10-CM

## 2022-09-26 MED ORDER — LACTATED RINGERS IV BOLUS
1000.0000 mL | Freq: Once | INTRAVENOUS | Status: AC
  Administered 2022-09-26: 1000 mL via INTRAVENOUS

## 2022-09-26 MED ORDER — IOHEXOL 350 MG/ML SOLN
125.0000 mL | Freq: Once | INTRAVENOUS | Status: AC | PRN
  Administered 2022-09-26: 125 mL via INTRAVENOUS

## 2022-09-26 MED ORDER — ONDANSETRON 4 MG PO TBDP: 4.0000 mg | ORAL_TABLET | Freq: Three times a day (TID) | ORAL | 0 refills | Status: AC | PRN

## 2022-09-26 NOTE — ED Provider Notes (Signed)
[facility] Provider Note    Event Date/Time   First MD Initiated Contact with Patient 09/26/22 [phone]     (approximate)   History   Chief Complaint Emesis and Diarrhea   HPI  Rachel Shaw is a 37 y.o. female with past medical history of hyperlipidemia, anxiety, and migraines who presents to the ED complaining of nausea and vomiting.  Patient reports that she was outside doing yard work earlier today when she began to feel sick to her stomach.  She went inside to drink some water and quickly started feeling worse, ended up having multiple episodes of vomiting and diarrhea.  She reports associated pain in the right upper and lower quadrants of her abdomen since then.  She also had some pain in her chest with vomiting but this is since resolved.  She denies any fevers, cough, shortness of breath, dysuria, or flank pain.  She is status postcholecystectomy.     Physical Exam   Triage Vital Signs: ED Triage Vitals  Enc Vitals Group     BP 09/25/22 2121 108/78     Pulse Rate 09/25/22 2121 (!) 111     Resp 09/25/22 2121 19     Temp 09/25/22 2121 98.7 F (37.1 C)     Temp Source 09/25/22 2121 Oral     SpO2 09/25/22 2121 97 %     Weight 09/25/22 2119 (!) 372 lb (168.7 kg)     Height 09/25/22 2119 5\' 10"  (1.778 m)     Head Circumference --      Peak Flow --      Pain Score 09/25/22 2118 4     Pain Loc --      Pain Edu? --      Excl. in GC? --     Most recent vital signs: Vitals:   09/25/22 2121 09/26/22 0110  BP: 108/78 105/68  Pulse: (!) 111 99  Resp: 19 16  Temp: 98.7 F (37.1 C) 98.1 F (36.7 C)  SpO2: 97% 98%    Constitutional: Alert and oriented. Eyes: Conjunctivae are normal. Head: Atraumatic. Nose: No congestion/rhinnorhea. Mouth/Throat: Mucous membranes are moist.  Cardiovascular: Normal rate, regular rhythm. Grossly normal heart sounds.  2+ radial pulses bilaterally. Respiratory: Normal respiratory effort.  No retractions. Lungs  CTAB. Gastrointestinal: Soft and tender to palpation in the right lower quadrant with no rebound or guarding. No distention. Musculoskeletal: No lower extremity tenderness nor edema.  Neurologic:  Normal speech and language. No gross focal neurologic deficits are appreciated.    ED Results / Procedures / Treatments   Labs (all labs ordered are listed, but only abnormal results are displayed) Labs Reviewed  CBC - Abnormal; Notable for the following components:      Result Value   WBC 21.1 (*)    RBC 5.67 (*)    MCV 78.3 (*)    MCH 24.0 (*)    All other components within normal limits  COMPREHENSIVE METABOLIC PANEL - Abnormal; Notable for the following components:   Glucose, Bld 117 (*)    Calcium 8.5 (*)    All other components within normal limits  URINALYSIS, ROUTINE W REFLEX MICROSCOPIC - Abnormal; Notable for the following components:   Color, Urine AMBER (*)    APPearance CLOUDY (*)    Hgb urine dipstick LARGE (*)    Protein, ur 100 (*)    Leukocytes,Ua TRACE (*)    All other components within normal limits  LIPASE, BLOOD  POC URINE PREG,  ED  TROPONIN I (HIGH SENSITIVITY)     EKG  ED ECG REPORT I, Chesley Noon, the attending physician, personally viewed and interpreted this ECG.   Date: 09/26/2022  EKG Time: 21:23  Rate: 104  Rhythm: sinus tachycardia  Axis: Normal  Intervals:none  ST&T Change: None  RADIOLOGY Chest x-ray reviewed and interpreted by me with no infiltrate, edema, or effusion.  PROCEDURES:  Critical Care performed: No  Procedures   MEDICATIONS ORDERED IN ED: Medications  ondansetron (ZOFRAN-ODT) disintegrating tablet 4 mg (4 mg Oral Given 09/25/22 2124)  lactated ringers bolus 1,000 mL (1,000 mLs Intravenous New Bag/Given 09/26/22 0158)  iohexol (OMNIPAQUE) 350 MG/ML injection 125 mL (125 mLs Intravenous Contrast Given 09/26/22 0223)     IMPRESSION / MDM / ASSESSMENT AND PLAN / ED COURSE  I reviewed the triage vital signs and the  nursing notes.                              37 y.o. female who past medical history of hyperlipidemia, anxiety, and migraines who presents to the ED complaining of nausea, vomiting, diarrhea, and abdominal discomfort since earlier this afternoon.  Patient's presentation is most consistent with acute presentation with potential threat to life or bodily function.  Differential diagnosis includes, but is not limited to, gastroenteritis, appendicitis, kidney stone, UTI, ectopic pregnancy, electrolyte abnormality, AKI, anemia, ACS, PE, pneumonia.  Patient nontoxic-appearing and in no acute distress, vital signs are unremarkable.  She reports feeling better following dose of IV Zofran given by EMS, but does have some tenderness in the right lower quadrant of her abdomen. Labs remarkable for leukocytosis but otherwise reassuring with no significant anemia, electrolyte abnormality, AKI, or LFT abnormality.  CT imaging obtained given her right lower quadrant tenderness and leukocytosis, but no evidence of appendicitis and is remarkable only for enteritis.  Pregnancy testing is negative and urinalysis shows no signs of infection.  She reports isolated episode of chest pain that has now resolved, EKG shows no evidence of arrhythmia or ischemia and I doubt ACS or PE.  Chest x-ray is also unremarkable.  Patient tolerating oral intake without difficulty at this time and is appropriate for discharge home with outpatient follow-up.  She was counseled to return to the ED for new or worsening symptoms, patient agrees with plan.      FINAL CLINICAL IMPRESSION(S) / ED DIAGNOSES   Final diagnoses:  Gastroenteritis     Rx / DC Orders   ED Discharge Orders          Ordered    ondansetron (ZOFRAN-ODT) 4 MG disintegrating tablet  Every 8 hours PRN        09/26/22 0341             Note:  This document was prepared using Dragon voice recognition software and may include unintentional dictation errors.    Chesley Noon, MD 09/26/22 [phone]

## 2022-09-26 NOTE — ED Notes (Signed)
Dr Jessup at bedside 

## 2023-02-17 IMAGING — MG DIGITAL DIAGNOSTIC BILAT W/ TOMO W/ CAD
6 of 12 series · 6 of 36 positions shown · non-contrast
Comparison: None.

CLINICAL DATA: 34-year-old female, recently postpartum, presenting
with a lump in the right breast at has been present for 5-6 years.
Additionally she has a area of intermittent focal pain in the right
breast.

EXAM:
DIGITAL DIAGNOSTIC BILATERAL MAMMOGRAM WITH TOMOSYNTHESIS AND CAD;
ULTRASOUND RIGHT BREAST LIMITED
TECHNIQUE: Bilateral digital diagnostic mammography and breast tomosynthesis
was performed. The images were evaluated with computer-aided
detection.; Targeted ultrasound examination of the right breast was
performed

[L CC synth-2D]
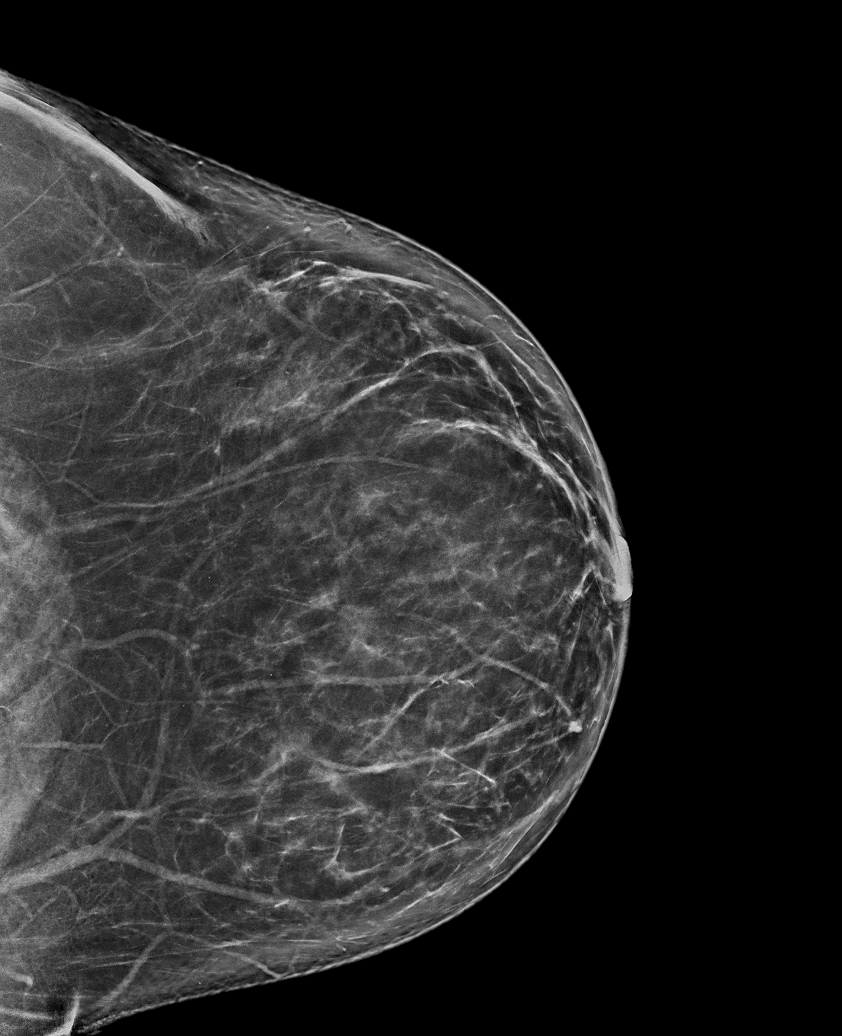

[R CC synth-2D (1 of 2)]
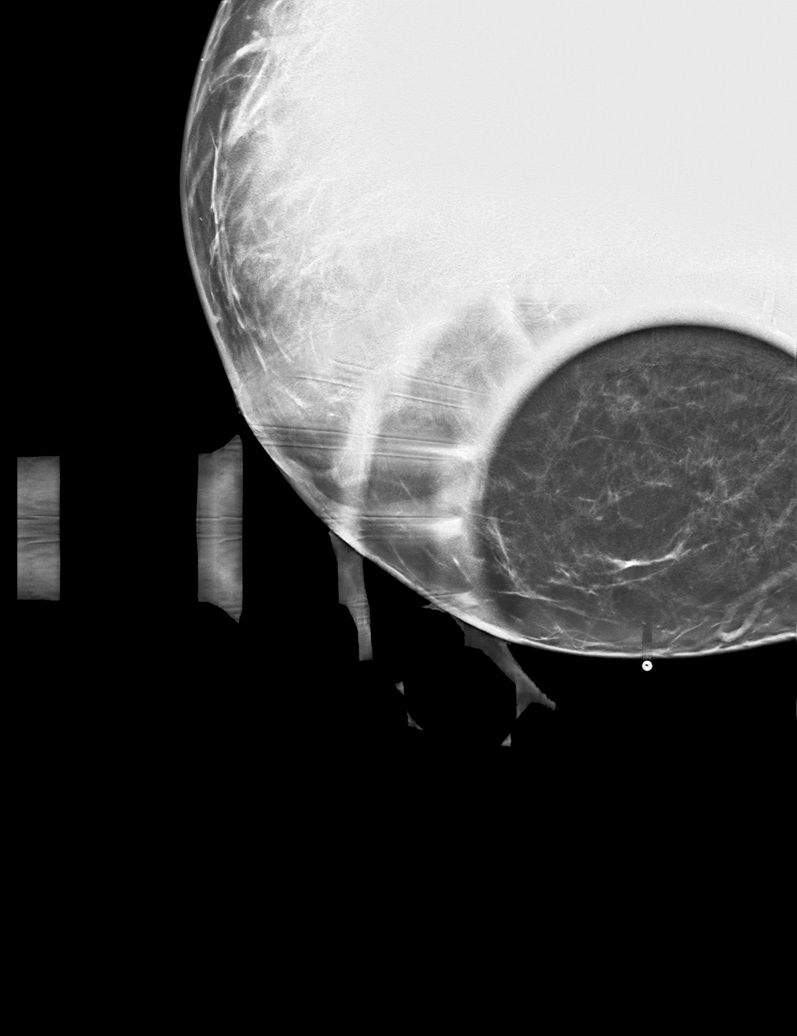

[R MLO synth-2D]
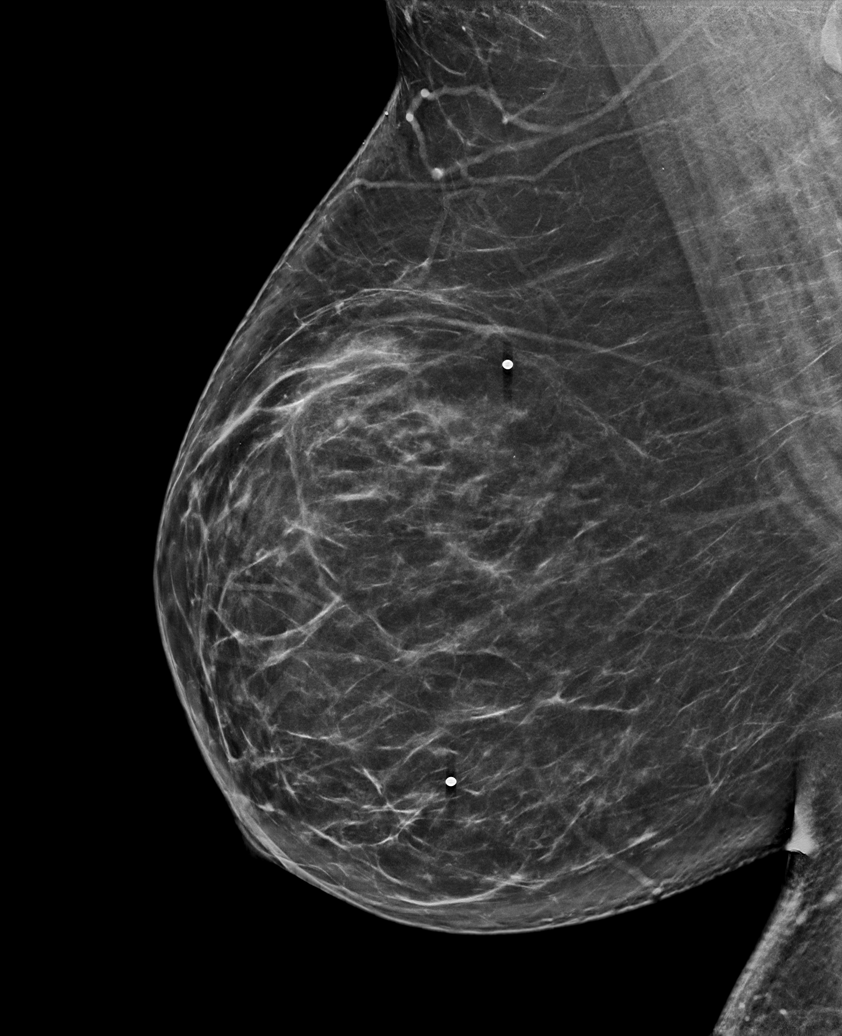

[L MLO synth-2D]
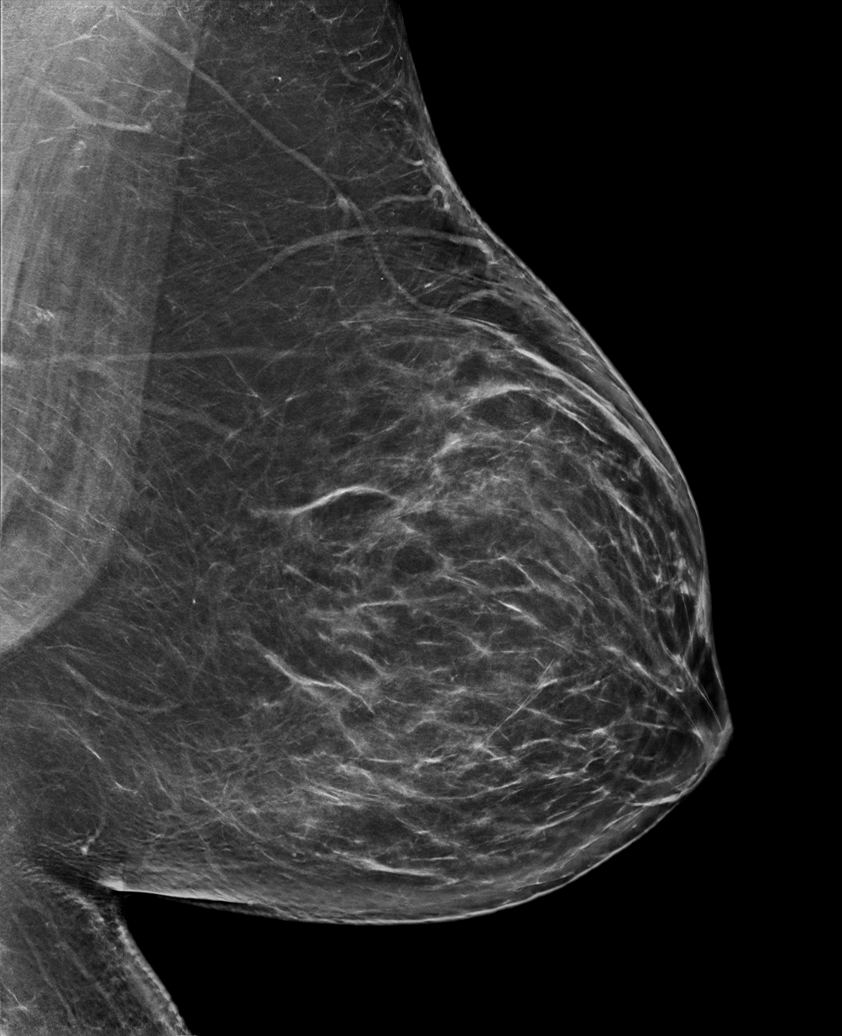

[R SIO synth-2D]
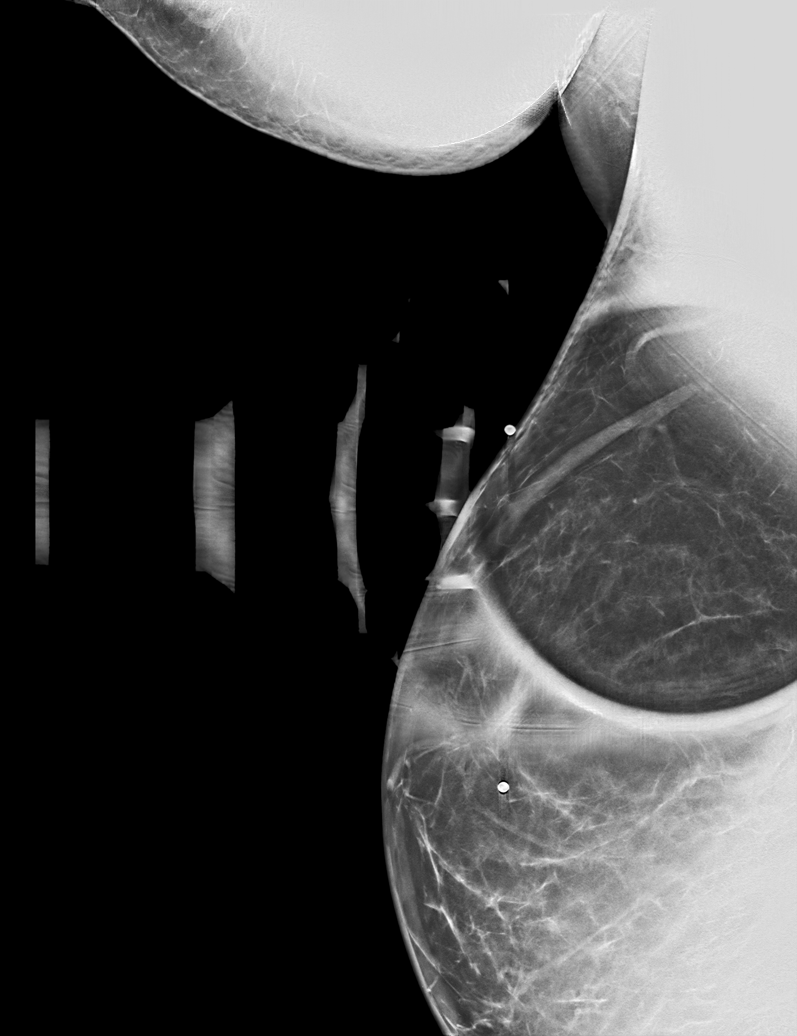

[R CC synth-2D (2 of 2)]
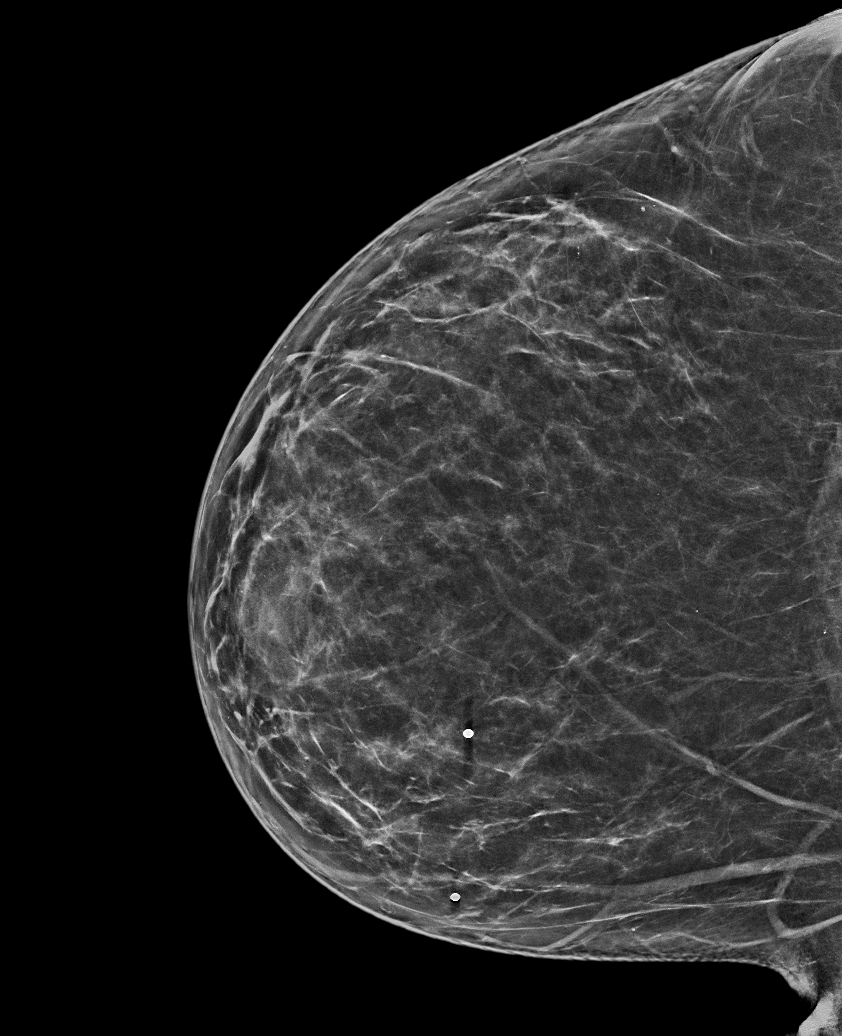

[6 of 36 positions shown; findings below may reference images not displayed]

ACR Breast Density Category b: There are scattered areas of
fibroglandular density.
FINDINGS: Mammogram:

Right breast: There are skin BBs marking the site of the lump
reported by the patient in the upper slightly inner right breast and
the site of pain in the lower inner right breast. Spot tangential
views of these areas were performed in addition to standard views.
No abnormality is identified. There is no suspicious mass,
calcification, or distortion in the right breast.

Left breast: No suspicious mass, distortion, or microcalcifications
are identified to suggest presence of malignancy.

On physical exam at the palpable site of concern reported by the
patient in the superior slightly medial breast I feel a ridge of
tissue without a fixed discrete mass. At the site of pain reported
by the patient in the lower aspect of the breast I feel normal
breast tissue. There are no obvious skin changes.

Ultrasound:

Targeted ultrasound is performed in the right breast at 2 o'clock 12
cm from the nipple at the palpable site of concern demonstrating no
cystic or solid mass.

Targeted ultrasound performed at the site of pain reported by the
patient from 3-4 o'clock 3 cm from the nipple demonstrating no
cystic or solid mass or other sonographic abnormality.
IMPRESSION: 1. No mammographic or sonographic evidence of malignancy or other
abnormality at the palpable/painful sites of concern in the right
breast.

2.  No mammographic evidence of malignancy in the left breast.

RECOMMENDATION:
1. Recommend any further workup of the right breast palpable site be
on a clinical basis.

2. Clinical follow-up as needed for the right breast pain. We
discussed some of the issues regarding breast pain including wearing
adequate support and limiting caffeine.

3.  Begin routine annual screening mammography at age 40.

I have discussed the findings and recommendations with the patient.
If applicable, a reminder letter will be sent to the patient
regarding the next appointment.

BI-RADS CATEGORY  1: Negative.
